# Patient Record
Sex: Male | Born: 1970 | Marital: Married | Smoking: Never smoker
Health system: Southern US, Community
[De-identification: ages and names within clinical notes are randomized; demographics above are authoritative.]

## PROBLEM LIST (undated history)

## (undated) DIAGNOSIS — J45909 Unspecified asthma, uncomplicated: Secondary | ICD-10-CM

## (undated) DIAGNOSIS — T8859XA Other complications of anesthesia, initial encounter: Secondary | ICD-10-CM

## (undated) DIAGNOSIS — T4145XA Adverse effect of unspecified anesthetic, initial encounter: Secondary | ICD-10-CM

## (undated) HISTORY — PX: APPENDECTOMY: SHX54

## (undated) HISTORY — PX: NASAL SINUS SURGERY: SHX719

## (undated) HISTORY — PX: HERNIA REPAIR: SHX51

## (undated) HISTORY — PX: TONSILLECTOMY: SUR1361

---

## 2012-11-29 ENCOUNTER — Emergency Department (HOSPITAL_COMMUNITY): Payer: 59

## 2012-11-29 ENCOUNTER — Encounter (HOSPITAL_COMMUNITY): Payer: Self-pay | Admitting: Emergency Medicine

## 2012-11-29 ENCOUNTER — Emergency Department (HOSPITAL_COMMUNITY)
Admission: EM | Admit: 2012-11-29 | Discharge: 2012-11-29 | Disposition: A | Discharge status: Home / Self Care | Payer: 59 | Attending: Emergency Medicine | Admitting: Emergency Medicine

## 2012-11-29 DIAGNOSIS — Y990 Civilian activity done for income or pay: Secondary | ICD-10-CM | POA: Insufficient documentation

## 2012-11-29 DIAGNOSIS — S5292XA Unspecified fracture of left forearm, initial encounter for closed fracture: Secondary | ICD-10-CM

## 2012-11-29 DIAGNOSIS — Y9289 Other specified places as the place of occurrence of the external cause: Secondary | ICD-10-CM | POA: Insufficient documentation

## 2012-11-29 DIAGNOSIS — W230XXA Caught, crushed, jammed, or pinched between moving objects, initial encounter: Secondary | ICD-10-CM | POA: Insufficient documentation

## 2012-11-29 DIAGNOSIS — S52309A Unspecified fracture of shaft of unspecified radius, initial encounter for closed fracture: Secondary | ICD-10-CM | POA: Insufficient documentation

## 2012-11-29 DIAGNOSIS — Y9389 Activity, other specified: Secondary | ICD-10-CM | POA: Insufficient documentation

## 2012-11-29 DIAGNOSIS — W010XXA Fall on same level from slipping, tripping and stumbling without subsequent striking against object, initial encounter: Secondary | ICD-10-CM | POA: Insufficient documentation

## 2012-11-29 MED ORDER — SODIUM CHLORIDE 0.9 % IV BOLUS (SEPSIS)
1000.0000 mL | Freq: Once | INTRAVENOUS | Status: AC
  Administered 2012-11-29: 1000 mL via INTRAVENOUS

## 2012-11-29 MED ORDER — MORPHINE SULFATE 4 MG/ML IJ SOLN
4.0000 mg | Freq: Once | INTRAMUSCULAR | Status: AC
  Administered 2012-11-29: 4 mg via INTRAVENOUS
  Filled 2012-11-29: qty 1

## 2012-11-29 MED ORDER — ONDANSETRON HCL 4 MG/2ML IJ SOLN
4.0000 mg | Freq: Once | INTRAMUSCULAR | Status: AC
  Administered 2012-11-29: 4 mg via INTRAVENOUS
  Filled 2012-11-29: qty 2

## 2012-11-29 MED ORDER — OXYCODONE-ACETAMINOPHEN 5-325 MG PO TABS: 1.0000 | ORAL_TABLET | ORAL | Status: DC | PRN

## 2012-11-29 NOTE — ED Notes (Signed)
Patient transported to X-ray 

## 2012-11-29 NOTE — Progress Notes (Signed)
Orthopedic Tech Progress Note Patient Details:  Chad Erickson Jun 29, 1970 [id number]  Ortho Devices Type of Ortho Device: Ace wrap;Arm sling;Sugartong splint Ortho Device/Splint Location: LUE Ortho Device/Splint Interventions: Ordered;Application   Jennye Moccasin 11/29/2012, 8:01 PM

## 2012-11-29 NOTE — ED Provider Notes (Signed)
CSN: [id number]     Arrival date & time 11/29/12  1529 History   First MD Initiated Contact with Patient 11/29/12 1537     Chief Complaint  Patient presents with  . Arm Injury   (Consider location/radiation/quality/duration/timing/severity/associated sxs/prior Treatment) HPI Chief complaint arm injury history provided by patient with help of interpreter  42 year old male comes to the arm injury. Patient reports he was at work when he tripped and fell over a piece of metal. During fall the patient's left arm did fall into belt drive on a machine which  patient was working with. Patient's arm stuck between a belt drive and a roller. Patient reported immediate pain in his left arm. He describes a sharp stabbing pain without radiation. It is localized to the middle of his forearm. No other pain is noted. 10 severity. It is made worse with palpation or movement. Patient was given fentanyl by EMS which provided some relief. No other alleviating factors noted. Patient denies any numbness tingling distal to the injury. All other associated signs and symptoms please refer to the review of systems section of chart.   History reviewed. No pertinent past medical history. History reviewed. No pertinent past surgical history. History reviewed. No pertinent family history. History  Substance Use Topics  . Smoking status: Never Smoker   . Smokeless tobacco: Never Used  . Alcohol Use: No    Review of Systems  Constitutional: Negative for fatigue.  Respiratory: Negative for shortness of breath.   Cardiovascular: Negative for chest pain.  Gastrointestinal: Negative for abdominal pain.  Genitourinary: Negative for dysuria.  Musculoskeletal: Positive for joint swelling (L arm- wrist and elbow feel swollen).  Skin: Negative for rash.  Neurological: Negative for headaches.  Psychiatric/Behavioral: Negative for agitation.  All other systems reviewed and are negative.    Allergies  Review of patient's  allergies indicates no known allergies.  Home Medications   Current Outpatient Rx  Name  Route  Sig  Dispense  Refill  . oxyCODONE-acetaminophen (PERCOCET/ROXICET) 5-325 MG per tablet   Oral   Take 1 tablet by mouth every 4 (four) hours as needed for severe pain.   20 tablet   0    BP 114/61  Pulse 75  Temp(Src) 98.6 F (37 C) (Oral)  Resp 15  SpO2 99% Physical Exam  Nursing note and vitals reviewed. Constitutional: He is oriented to person, place, and time. He appears well-developed and well-nourished.  HENT:  Head: Normocephalic and atraumatic.  Eyes: EOM are normal. Pupils are equal, round, and reactive to light.  Neck: Normal range of motion.  Cardiovascular: Normal rate, regular rhythm and intact distal pulses.   Pulmonary/Chest: Effort normal and breath sounds normal. No respiratory distress.  Abdominal: Soft. He exhibits no distension. There is no tenderness.  Musculoskeletal: Normal range of motion.  L forearm ttp and visual deformity of limb. Neurovasc intact throughout. No laceration of signs of open fracture   Neurological: He is alert and oriented to person, place, and time. No cranial nerve deficit. He exhibits normal muscle tone. Coordination normal.  Skin: Skin is warm and dry. No rash noted.  Small superficial abrasion to skin on L elbow from belt drive scrapping elbow.   Psychiatric: He has a normal mood and affect. His behavior is normal. Judgment and thought content normal.    ED Course  Procedures (including critical care time) Labs Review Labs Reviewed - No data to display Imaging Review Dg Elbow 2 Views Left  11/29/2012   CLINICAL  DATA:  Trauma  EXAM: LEFT ELBOW - 2 VIEW  COMPARISON:  None.  FINDINGS: There is no evidence of fracture, dislocation, or joint effusion within the elbow. There is no evidence of arthropathy or other focal bone abnormality. Soft tissues are unremarkable. A radial shaft fracture is partially visualized.  IMPRESSION: No  evidence of acute osseous abnormalities within the elbow.   Electronically Signed   By: Salome Holmes M.D.   On: 11/29/2012 17:46   Dg Forearm Left  11/29/2012   CLINICAL DATA:  Trauma  EXAM: LEFT FOREARM - 2 VIEW  COMPARISON:  None.  FINDINGS: Comminuted fracture identified along the midshaft of the radius demonstrating dorsal ulnar directed displacement and angulation.  IMPRESSION: Comminuted radial shaft fracture   Electronically Signed   By: Salome Holmes M.D.   On: 11/29/2012 17:44   Dg Wrist Complete Left  11/29/2012   CLINICAL DATA:  Trauma, pain  EXAM: LEFT WRIST - COMPLETE 3+ VIEW  COMPARISON:  None.  FINDINGS: There is no evidence of fracture or dislocation. There is no evidence of arthropathy or other focal bone abnormality. Soft tissues are unremarkable.  IMPRESSION: Negative.   Electronically Signed   By: Salome Holmes M.D.   On: 11/29/2012 17:43   Dg Humerus Left  11/29/2012   CLINICAL DATA:  Trauma, fall, injury left to left forearm  EXAM: LEFT HUMERUS - 2+ VIEW  COMPARISON:  None  FINDINGS: Osseous mineralization normal.  Joint spaces preserved.  No fracture, dislocation, or bone destruction.  IMPRESSION: No acute osseous abnormalities.   Electronically Signed   By: Ulyses Southward M.D.   On: 11/29/2012 17:47    EKG Interpretation   None       MDM   1. Fracture, radius, left, closed, initial encounter    Afebrile vital signs stable on arrival. 42 year old male with left arm injury. Injury occurred during fall at work when arm got stuck in it machinery belt. Patient denies any further injuries to the head had no loss of consciousness no tenderness to palpation on thorough examination. Pain localized left arm. X-ray demonstrated comminuted fracture of the midshaft radius. On exam patient neurovascularly intact. No signs of compartment syndrome on exam. Orthopedic consult and recommend application sugar tong splint follow up with clinic tomorrow.  8:09 PM Sugar tong placed by  orthopedic tech. On reexam patient is complaining of minimal pain. Strong pulses distal to injury neurovascularly intact. The patient was given strict precautions for numbness tingling motor dysfunction etc. Patient followed in clinic or morning. Was given a prescription for oxycodone for pain management. Remained stable until discharge   Patient discussed with attending Dr. Freida Busman.       Bridgett Larsson, MD 11/29/12 2013

## 2012-11-29 NOTE — ED Notes (Signed)
As pt fell, his left arm went into a machine which had a "roller band" that was actively turned on up to his elbow.

## 2012-11-29 NOTE — ED Provider Notes (Signed)
CSN: [id number]     Arrival date & time 11/29/12  1529 History   First MD Initiated Contact with Patient 11/29/12 1537     Chief Complaint  Patient presents with  . Arm Injury   (Consider location/radiation/quality/duration/timing/severity/associated sxs/prior Treatment) HPI Comments: Pt at work and had a fall. During fall got L arm pushed into machine belt. Fell to ground did not hit head, no LOC, arm out on fall. Felt pop in forearm. Tetanus UTD per pt.    Patient is a 42 y.o. male presenting with trauma and arm injury.  Trauma   Current symptoms:      Associated symptoms:            Denies abdominal pain, back pain, chest pain and headache.  Arm Injury Location:  Arm Time since incident:  1 hour Injury: yes   Mechanism of injury comment:  Fall and crush Arm location:  L forearm Pain details:    Quality:  Sharp   Radiates to:  Does not radiate   Severity:  Severe   Onset quality:  Sudden   Timing:  Constant   Progression:  Unchanged Chronicity:  New Tetanus status:  Up to date Prior injury to area:  No Relieved by: fentanyl. Worsened by:  Movement Associated symptoms: no back pain, no decreased range of motion, no fatigue, no numbness and no tingling     History reviewed. No pertinent past medical history. History reviewed. No pertinent past surgical history. History reviewed. No pertinent family history. History  Substance Use Topics  . Smoking status: Never Smoker   . Smokeless tobacco: Never Used  . Alcohol Use: No    Review of Systems  Constitutional: Negative for fatigue.  Respiratory: Negative for shortness of breath.   Cardiovascular: Negative for chest pain.  Gastrointestinal: Negative for abdominal pain.  Genitourinary: Negative for dysuria.  Musculoskeletal: Negative for back pain.  Skin: Negative for rash.  Neurological: Negative for headaches.  Psychiatric/Behavioral: Negative for agitation.  All other systems reviewed and are  negative.    Allergies  Review of patient's allergies indicates no known allergies.  Home Medications   Current Outpatient Rx  Name  Route  Sig  Dispense  Refill  . oxyCODONE-acetaminophen (PERCOCET/ROXICET) 5-325 MG per tablet   Oral   Take 1 tablet by mouth every 4 (four) hours as needed for severe pain.   20 tablet   0    BP 113/66  Pulse 76  Temp(Src) 98.6 F (37 C) (Oral)  Resp 14  SpO2 97% Physical Exam  Nursing note and vitals reviewed. Constitutional: He is oriented to person, place, and time. He appears well-developed and well-nourished.  HENT:  Head: Normocephalic and atraumatic.  Eyes: EOM are normal. Pupils are equal, round, and reactive to light.  Neck: Normal range of motion.  Cardiovascular: Normal rate, regular rhythm and intact distal pulses.   Pulmonary/Chest: Effort normal and breath sounds normal. No respiratory distress.  Abdominal: Soft. He exhibits no distension. There is no tenderness.  Musculoskeletal: Normal range of motion.  Neurological: He is alert and oriented to person, place, and time. No cranial nerve deficit. He exhibits normal muscle tone. Coordination normal.  Skin: Skin is warm and dry. No rash noted.  Psychiatric: He has a normal mood and affect. His behavior is normal. Judgment and thought content normal.    ED Course  Procedures (including critical care time) Labs Review Labs Reviewed - No data to display Imaging Review Dg Elbow 2 Views Left  11/29/2012   CLINICAL DATA:  Trauma  EXAM: LEFT ELBOW - 2 VIEW  COMPARISON:  None.  FINDINGS: There is no evidence of fracture, dislocation, or joint effusion within the elbow. There is no evidence of arthropathy or other focal bone abnormality. Soft tissues are unremarkable. A radial shaft fracture is partially visualized.  IMPRESSION: No evidence of acute osseous abnormalities within the elbow.   Electronically Signed   By: Salome Holmes M.D.   On: 11/29/2012 17:46   Dg Forearm  Left  11/29/2012   CLINICAL DATA:  Trauma  EXAM: LEFT FOREARM - 2 VIEW  COMPARISON:  None.  FINDINGS: Comminuted fracture identified along the midshaft of the radius demonstrating dorsal ulnar directed displacement and angulation.  IMPRESSION: Comminuted radial shaft fracture   Electronically Signed   By: Salome Holmes M.D.   On: 11/29/2012 17:44   Dg Wrist Complete Left  11/29/2012   CLINICAL DATA:  Trauma, pain  EXAM: LEFT WRIST - COMPLETE 3+ VIEW  COMPARISON:  None.  FINDINGS: There is no evidence of fracture or dislocation. There is no evidence of arthropathy or other focal bone abnormality. Soft tissues are unremarkable.  IMPRESSION: Negative.   Electronically Signed   By: Salome Holmes M.D.   On: 11/29/2012 17:43   Dg Humerus Left  11/29/2012   CLINICAL DATA:  Trauma, fall, injury left to left forearm  EXAM: LEFT HUMERUS - 2+ VIEW  COMPARISON:  None  FINDINGS: Osseous mineralization normal.  Joint spaces preserved.  No fracture, dislocation, or bone destruction.  IMPRESSION: No acute osseous abnormalities.   Electronically Signed   By: Ulyses Southward M.D.   On: 11/29/2012 17:47    EKG Interpretation   None       MDM   1. Fracture, radius, left, closed, initial encounter    AFVSS, NAD. Patient with obvious deformity of left arm and severe tenderness to palpation. On initial exam patient was neurovascularly intact. No other injuries are noted on thorough secondary examination. X-ray obtained showed a comminuted fracture of the midshaft radius. Small abrasion above the elbow however no lacerations. No signs of open fracture. Orthopedics was consulted believe appropriate for follow up in clinic tomorrow. Patient was placed in a sugar tong splint. Examination Brandell Maready splinting revealed intact distal pulses. Neuro exam within normal limits. Patient discharged home with prescription for Norco followup with ortho clinic tomorrow.   Patient discussed with attending Dr. Freida Busman.      Bridgett Larsson,  MD 11/30/12 [phone]

## 2012-11-29 NOTE — ED Provider Notes (Signed)
I saw and evaluated the patient, reviewed the resident's note and I agree with the findings and plan.  EKG Interpretation   None       Patient seen examined and left humerus with abrasion without signs of compartment syndrome. Tender left forearm as well with range of motion of the left hand. Deformity noted. Awaiting x-rays. Radial pulse 2+. No apparent compartment syndrome  Toy Baker, MD 11/29/12 [phone]

## 2012-11-29 NOTE — ED Notes (Signed)
Pt from work via [employer].  Pt tripped over metal and fell landing on his left arm.  Deformity to left forearm noted and abrasion to left upper arm.  EMS gave 150 mg fentanyl. Pt in NAD, A&O.

## 2012-11-30 ENCOUNTER — Encounter (HOSPITAL_COMMUNITY): Payer: Self-pay | Admitting: *Deleted

## 2012-11-30 NOTE — ED Provider Notes (Signed)
I saw and evaluated the patient, reviewed the resident's note and I agree with the findings and plan.  Jozette Castrellon T Kassity Woodson, MD 11/30/12 1541 

## 2012-11-30 NOTE — ED Provider Notes (Signed)
I saw and evaluated the patient, reviewed the resident's note and I agree with the findings and plan.  Toy Baker, MD 11/30/12 1538

## 2012-12-03 ENCOUNTER — Encounter (HOSPITAL_COMMUNITY): Payer: Self-pay | Admitting: *Deleted

## 2012-12-03 ENCOUNTER — Ambulatory Visit (HOSPITAL_COMMUNITY): Payer: Worker's Compensation | Admitting: Anesthesiology

## 2012-12-03 ENCOUNTER — Encounter (HOSPITAL_COMMUNITY)
Admission: RE | Disposition: A | Discharge status: Home / Self Care | Payer: Self-pay | Source: Ambulatory Visit | Attending: Orthopedic Surgery

## 2012-12-03 ENCOUNTER — Encounter (HOSPITAL_COMMUNITY): Payer: Worker's Compensation | Admitting: Anesthesiology

## 2012-12-03 ENCOUNTER — Ambulatory Visit (HOSPITAL_COMMUNITY)
Admission: RE | Admit: 2012-12-03 | Discharge: 2012-12-05 | Disposition: A | Discharge status: Home / Self Care | Payer: Worker's Compensation | Source: Ambulatory Visit | Attending: Orthopedic Surgery | Admitting: Orthopedic Surgery

## 2012-12-03 DIAGNOSIS — W19XXXA Unspecified fall, initial encounter: Secondary | ICD-10-CM | POA: Insufficient documentation

## 2012-12-03 DIAGNOSIS — S52309A Unspecified fracture of shaft of unspecified radius, initial encounter for closed fracture: Secondary | ICD-10-CM | POA: Insufficient documentation

## 2012-12-03 HISTORY — PX: ORIF RADIAL FRACTURE: SHX5113

## 2012-12-03 HISTORY — DX: Other complications of anesthesia, initial encounter: T88.59XA

## 2012-12-03 HISTORY — DX: Adverse effect of unspecified anesthetic, initial encounter: T41.45XA

## 2012-12-03 LAB — CBC
HCT: 42.9 % (ref 39.0–52.0)
Hemoglobin: 15.4 g/dL (ref 13.0–17.0)
MCH: 33.3 pg (ref 26.0–34.0)
MCHC: 35.9 g/dL (ref 30.0–36.0)
MCV: 92.9 fL (ref 78.0–100.0)
RBC: 4.62 MIL/uL (ref 4.22–5.81)

## 2012-12-03 SURGERY — OPEN REDUCTION INTERNAL FIXATION (ORIF) RADIAL FRACTURE
Anesthesia: Regional | Site: Arm Lower | Laterality: Left | Wound class: Clean

## 2012-12-03 MED ORDER — HYDROMORPHONE HCL PF 1 MG/ML IJ SOLN
INTRAMUSCULAR | Status: AC
  Filled 2012-12-03: qty 1

## 2012-12-03 MED ORDER — MIDAZOLAM HCL 2 MG/2ML IJ SOLN
2.0000 mg | Freq: Once | INTRAMUSCULAR | Status: AC
  Administered 2012-12-03: 2 mg via INTRAVENOUS

## 2012-12-03 MED ORDER — FENTANYL CITRATE 0.05 MG/ML IJ SOLN
INTRAMUSCULAR | Status: AC
  Administered 2012-12-03: 100 ug via INTRAVENOUS
  Filled 2012-12-03: qty 2

## 2012-12-03 MED ORDER — FENTANYL CITRATE 0.05 MG/ML IJ SOLN
INTRAMUSCULAR | Status: DC | PRN
  Administered 2012-12-03 (×2): 50 ug via INTRAVENOUS

## 2012-12-03 MED ORDER — DOCUSATE SODIUM 100 MG PO CAPS
100.0000 mg | ORAL_CAPSULE | Freq: Two times a day (BID) | ORAL | Status: DC
  Administered 2012-12-04 – 2012-12-05 (×3): 100 mg via ORAL
  Filled 2012-12-03 (×5): qty 1

## 2012-12-03 MED ORDER — LACTATED RINGERS IV SOLN
INTRAVENOUS | Status: DC | PRN
  Administered 2012-12-03 (×2): via INTRAVENOUS

## 2012-12-03 MED ORDER — LACTATED RINGERS IV SOLN
INTRAVENOUS | Status: DC
  Administered 2012-12-03: 16:00:00 via INTRAVENOUS

## 2012-12-03 MED ORDER — ONDANSETRON HCL 4 MG PO TABS: 4.0000 mg | ORAL_TABLET | Freq: Four times a day (QID) | ORAL | Status: DC | PRN

## 2012-12-03 MED ORDER — OXYCODONE-ACETAMINOPHEN 5-325 MG PO TABS
1.0000 | ORAL_TABLET | ORAL | Status: DC | PRN
  Administered 2012-12-04 – 2012-12-05 (×2): 2 via ORAL
  Filled 2012-12-03 (×2): qty 2

## 2012-12-03 MED ORDER — CHLORHEXIDINE GLUCONATE 4 % EX LIQD: 60.0000 mL | Freq: Once | CUTANEOUS | Status: DC

## 2012-12-03 MED ORDER — MIDAZOLAM HCL 2 MG/2ML IJ SOLN
INTRAMUSCULAR | Status: AC
  Administered 2012-12-03: 2 mg via INTRAVENOUS
  Filled 2012-12-03: qty 2

## 2012-12-03 MED ORDER — OXYCODONE HCL 5 MG PO TABS: 5.0000 mg | ORAL_TABLET | Freq: Once | ORAL | Status: DC | PRN

## 2012-12-03 MED ORDER — OXYCODONE-ACETAMINOPHEN 10-325 MG PO TABS: 1.0000 | ORAL_TABLET | ORAL | Status: DC | PRN

## 2012-12-03 MED ORDER — VITAMIN C 500 MG PO TABS
1000.0000 mg | ORAL_TABLET | Freq: Every day | ORAL | Status: DC
  Administered 2012-12-04 – 2012-12-05 (×2): 1000 mg via ORAL
  Filled 2012-12-03 (×2): qty 2

## 2012-12-03 MED ORDER — FENTANYL CITRATE 0.05 MG/ML IJ SOLN
100.0000 ug | Freq: Once | INTRAMUSCULAR | Status: AC
Start: 2012-12-03 — End: 2012-12-03
  Administered 2012-12-03: 100 ug via INTRAVENOUS

## 2012-12-03 MED ORDER — KCL IN DEXTROSE-NACL 20-5-0.45 MEQ/L-%-% IV SOLN
INTRAVENOUS | Status: DC
  Administered 2012-12-03: 23:00:00 via INTRAVENOUS
  Filled 2012-12-03 (×5): qty 1000

## 2012-12-03 MED ORDER — OXYCODONE HCL 5 MG/5ML PO SOLN: 5.0000 mg | Freq: Once | ORAL | Status: DC | PRN

## 2012-12-03 MED ORDER — METHOCARBAMOL 500 MG PO TABS: 500.0000 mg | ORAL_TABLET | Freq: Four times a day (QID) | ORAL | Status: DC

## 2012-12-03 MED ORDER — ONDANSETRON HCL 4 MG PO TABS: 4.0000 mg | ORAL_TABLET | Freq: Three times a day (TID) | ORAL | Status: DC | PRN

## 2012-12-03 MED ORDER — INFLUENZA VAC SPLIT QUAD 0.5 ML IM SUSP
0.5000 mL | INTRAMUSCULAR | Status: DC
  Filled 2012-12-03: qty 0.5

## 2012-12-03 MED ORDER — DIPHENHYDRAMINE HCL 25 MG PO CAPS: 25.0000 mg | ORAL_CAPSULE | Freq: Four times a day (QID) | ORAL | Status: DC | PRN

## 2012-12-03 MED ORDER — METHOCARBAMOL 500 MG PO TABS
500.0000 mg | ORAL_TABLET | Freq: Four times a day (QID) | ORAL | Status: DC | PRN
  Administered 2012-12-04 – 2012-12-05 (×2): 500 mg via ORAL
  Filled 2012-12-03 (×3): qty 1

## 2012-12-03 MED ORDER — BUPIVACAINE-EPINEPHRINE PF 0.5-1:200000 % IJ SOLN
INTRAMUSCULAR | Status: DC | PRN
  Administered 2012-12-03: 30 mL via PERINEURAL

## 2012-12-03 MED ORDER — BUPIVACAINE HCL (PF) 0.25 % IJ SOLN
INTRAMUSCULAR | Status: DC | PRN
  Administered 2012-12-03: 8 mL

## 2012-12-03 MED ORDER — HYDROMORPHONE HCL PF 1 MG/ML IJ SOLN: 0.2500 mg | INTRAMUSCULAR | Status: DC | PRN

## 2012-12-03 MED ORDER — MIDAZOLAM HCL 5 MG/5ML IJ SOLN
INTRAMUSCULAR | Status: DC | PRN
  Administered 2012-12-03: 2 mg via INTRAVENOUS

## 2012-12-03 MED ORDER — ZOLPIDEM TARTRATE 5 MG PO TABS: 5.0000 mg | ORAL_TABLET | Freq: Every evening | ORAL | Status: DC | PRN

## 2012-12-03 MED ORDER — ONDANSETRON HCL 4 MG/2ML IJ SOLN: 4.0000 mg | Freq: Four times a day (QID) | INTRAMUSCULAR | Status: DC | PRN

## 2012-12-03 MED ORDER — HYDROMORPHONE HCL PF 1 MG/ML IJ SOLN
0.5000 mg | INTRAMUSCULAR | Status: DC | PRN
  Administered 2012-12-04: 1 mg via INTRAVENOUS
  Filled 2012-12-03: qty 1

## 2012-12-03 MED ORDER — CEFAZOLIN SODIUM 1-5 GM-% IV SOLN
1.0000 g | Freq: Three times a day (TID) | INTRAVENOUS | Status: DC
  Administered 2012-12-04 – 2012-12-05 (×4): 1 g via INTRAVENOUS
  Filled 2012-12-03 (×9): qty 50

## 2012-12-03 MED ORDER — METHOCARBAMOL 100 MG/ML IJ SOLN
500.0000 mg | Freq: Four times a day (QID) | INTRAVENOUS | Status: DC | PRN
  Filled 2012-12-03: qty 5

## 2012-12-03 MED ORDER — 0.9 % SODIUM CHLORIDE (POUR BTL) OPTIME
TOPICAL | Status: DC | PRN
  Administered 2012-12-03: 1000 mL

## 2012-12-03 MED ORDER — LIDOCAINE HCL (CARDIAC) 20 MG/ML IV SOLN
INTRAVENOUS | Status: DC | PRN
  Administered 2012-12-03: 60 mg via INTRAVENOUS

## 2012-12-03 MED ORDER — CEFAZOLIN SODIUM 1-5 GM-% IV SOLN: 1.0000 g | INTRAVENOUS | Status: DC

## 2012-12-03 MED ORDER — CEFAZOLIN SODIUM-DEXTROSE 2-3 GM-% IV SOLR
2.0000 g | INTRAVENOUS | Status: AC
  Administered 2012-12-03: 2 g via INTRAVENOUS
  Filled 2012-12-03: qty 50

## 2012-12-03 MED ORDER — VITAMIN C 500 MG PO TABS: 500.0000 mg | ORAL_TABLET | Freq: Every day | ORAL | Status: DC

## 2012-12-03 MED ORDER — HYDROCODONE-ACETAMINOPHEN 10-325 MG PO TABS
1.0000 | ORAL_TABLET | ORAL | Status: DC | PRN
  Administered 2012-12-04 – 2012-12-05 (×3): 2 via ORAL
  Filled 2012-12-03 (×3): qty 2

## 2012-12-03 MED ORDER — ONDANSETRON HCL 4 MG/2ML IJ SOLN
INTRAMUSCULAR | Status: DC | PRN
  Administered 2012-12-03: 4 mg via INTRAVENOUS

## 2012-12-03 MED ORDER — OXYCODONE-ACETAMINOPHEN 5-325 MG PO TABS: 1.0000 | ORAL_TABLET | ORAL | Status: DC | PRN

## 2012-12-03 MED ORDER — DOCUSATE SODIUM 100 MG PO CAPS: 100.0000 mg | ORAL_CAPSULE | Freq: Two times a day (BID) | ORAL | Status: DC

## 2012-12-03 MED ORDER — PROPOFOL 10 MG/ML IV BOLUS
INTRAVENOUS | Status: DC | PRN
  Administered 2012-12-03: 200 mg via INTRAVENOUS

## 2012-12-03 SURGICAL SUPPLY — 59 items
BANDAGE ELASTIC 3 VELCRO ST LF (GAUZE/BANDAGES/DRESSINGS) ×2 IMPLANT
BANDAGE ELASTIC 4 VELCRO ST LF (GAUZE/BANDAGES/DRESSINGS) ×2 IMPLANT
BANDAGE GAUZE ELAST BULKY 4 IN (GAUZE/BANDAGES/DRESSINGS) ×2 IMPLANT
BIT DRILL 2.5X2.75 QC CALB (BIT) ×2 IMPLANT
BLADE SURG ROTATE 9660 (MISCELLANEOUS) IMPLANT
BNDG ESMARK 4X9 LF (GAUZE/BANDAGES/DRESSINGS) ×2 IMPLANT
CLOTH BEACON ORANGE TIMEOUT ST (SAFETY) IMPLANT
CORDS BIPOLAR (ELECTRODE) ×2 IMPLANT
COVER SURGICAL LIGHT HANDLE (MISCELLANEOUS) ×2 IMPLANT
CUFF TOURNIQUET SINGLE 18IN (TOURNIQUET CUFF) ×2 IMPLANT
CUFF TOURNIQUET SINGLE 24IN (TOURNIQUET CUFF) IMPLANT
DRAIN TLS ROUND 10FR (DRAIN) IMPLANT
DRAPE INCISE IOBAN 66X45 STRL (DRAPES) ×2 IMPLANT
DRAPE OEC MINIVIEW 54X84 (DRAPES) ×4 IMPLANT
DRAPE SURG 17X11 SM STRL (DRAPES) ×2 IMPLANT
DRSG ADAPTIC 3X8 NADH LF (GAUZE/BANDAGES/DRESSINGS) ×2 IMPLANT
ELECT REM PT RETURN 9FT ADLT (ELECTROSURGICAL)
ELECTRODE REM PT RTRN 9FT ADLT (ELECTROSURGICAL) IMPLANT
GAUZE SPONGE 4X4 16PLY XRAY LF (GAUZE/BANDAGES/DRESSINGS) ×2 IMPLANT
GLOVE BIOGEL PI IND STRL 8.5 (GLOVE) ×1 IMPLANT
GLOVE BIOGEL PI INDICATOR 8.5 (GLOVE) ×1
GLOVE SURG ORTHO 8.0 STRL STRW (GLOVE) ×2 IMPLANT
GOWN PREVENTION PLUS XLARGE (GOWN DISPOSABLE) ×2 IMPLANT
GOWN STRL NON-REIN LRG LVL3 (GOWN DISPOSABLE) ×4 IMPLANT
KIT BASIN OR (CUSTOM PROCEDURE TRAY) ×2 IMPLANT
KIT ROOM TURNOVER OR (KITS) ×2 IMPLANT
MANIFOLD NEPTUNE II (INSTRUMENTS) ×2 IMPLANT
NEEDLE HYPO 25X1 1.5 SAFETY (NEEDLE) ×2 IMPLANT
NS IRRIG 1000ML POUR BTL (IV SOLUTION) ×2 IMPLANT
PACK ORTHO EXTREMITY (CUSTOM PROCEDURE TRAY) ×2 IMPLANT
PAD ARMBOARD 7.5X6 YLW CONV (MISCELLANEOUS) ×4 IMPLANT
PAD CAST 3X4 CTTN HI CHSV (CAST SUPPLIES) ×1 IMPLANT
PAD CAST 4YDX4 CTTN HI CHSV (CAST SUPPLIES) ×1 IMPLANT
PADDING CAST ABS 3INX4YD NS (CAST SUPPLIES) ×1
PADDING CAST ABS COTTON 3X4 (CAST SUPPLIES) ×1 IMPLANT
PADDING CAST COTTON 3X4 STRL (CAST SUPPLIES) ×1
PADDING CAST COTTON 4X4 STRL (CAST SUPPLIES) ×1
PLATE LOCK COMP 7H FOOT (Plate) ×2 IMPLANT
SCREW CORTICAL 3.5MM  16MM (Screw) ×3 IMPLANT
SCREW CORTICAL 3.5MM 14MM (Screw) ×2 IMPLANT
SCREW CORTICAL 3.5MM 16MM (Screw) ×3 IMPLANT
SCREW CORTICAL 3.5MM 18MM (Screw) ×6 IMPLANT
SOAP 2 % CHG 4 OZ (WOUND CARE) ×2 IMPLANT
SPONGE GAUZE 4X4 12PLY (GAUZE/BANDAGES/DRESSINGS) ×2 IMPLANT
SPONGE LAP 4X18 X RAY DECT (DISPOSABLE) ×2 IMPLANT
STRIP CLOSURE SKIN 1/2X4 (GAUZE/BANDAGES/DRESSINGS) IMPLANT
SUT CHROMIC 3 0 PS 2 (SUTURE) IMPLANT
SUT ETHILON 4 0 PS 2 18 (SUTURE) ×2 IMPLANT
SUT MNCRL AB 4-0 PS2 18 (SUTURE) IMPLANT
SUT PROLENE 3 0 PS 2 (SUTURE) ×4 IMPLANT
SUT VIC AB 2-0 FS1 27 (SUTURE) ×2 IMPLANT
SUT VICRYL 4-0 PS2 18IN ABS (SUTURE) ×4 IMPLANT
SYR CONTROL 10ML LL (SYRINGE) IMPLANT
SYSTEM CHEST DRAIN TLS 7FR (DRAIN) IMPLANT
TOWEL OR 17X24 6PK STRL BLUE (TOWEL DISPOSABLE) ×2 IMPLANT
TOWEL OR 17X26 10 PK STRL BLUE (TOWEL DISPOSABLE) ×2 IMPLANT
TUBE CONNECTING 12X1/4 (SUCTIONS) ×2 IMPLANT
WATER STERILE IRR 1000ML POUR (IV SOLUTION) IMPLANT
YANKAUER SUCT BULB TIP NO VENT (SUCTIONS) IMPLANT

## 2012-12-03 NOTE — Anesthesia Postprocedure Evaluation (Signed)
  Anesthesia Post-op Note  Patient: Chad Erickson  Procedure(s) Performed: Procedure(s): OPEN REDUCTION INTERNAL FIXATION (ORIF) LEFT RADIAL SHAFT FRACTURE (Left)  Patient Location: PACU  Anesthesia Type:General  Level of Consciousness: awake, alert , oriented and patient cooperative  Airway and Oxygen Therapy: Patient Spontanous Breathing  Post-op Pain: mild  Post-op Assessment: Post-op Vital signs reviewed, Patient's Cardiovascular Status Stable, Respiratory Function Stable, Patent Airway, No signs of Nausea or vomiting and Pain level controlled  Post-op Vital Signs: stable  Complications: No apparent anesthesia complications

## 2012-12-03 NOTE — Anesthesia Procedure Notes (Signed)
Anesthesia Regional Block:  Supraclavicular block  Pre-Anesthetic Checklist: ,, timeout performed, Correct Patient, Correct Site, Correct Laterality, Correct Procedure, Correct Position, site marked, Risks and benefits discussed, pre-op evaluation, post-op pain management  Laterality: Left  Prep: Maximum Sterile Barrier Precautions used and chloraprep       Needles:  Injection technique: Single-shot  Needle Type: Echogenic Stimulator Needle     Needle Length: 5cm 5 cm Needle Gauge: 22 and 22 G    Additional Needles:  Procedures: ultrasound guided (picture in chart) Supraclavicular block Narrative:  Start time: 12/03/2012 4:23 PM End time: 12/03/2012 4:33 PM Injection made incrementally with aspirations every 5 mL. Anesthesiologist: Kashten Gowin,MD  Additional Notes: 2% Lidocaine skin wheel. Intercostobrachial block with 8cc of 0.25% Bupivicaine plain.

## 2012-12-03 NOTE — H&P (Signed)
Chad Erickson is an 42 y.o. male.   Chief Complaint: Left forearm fracture HPI: Pt with fall at work sustaining closed left forearm fracture Pt seen/evaluated in office and here for surgery today No prior injury to forearm RHD  Past Medical History  Diagnosis Date  . Complication of anesthesia     ? water in lungs after hernia surgery    Past Surgical History  Procedure Laterality Date  . Nasal sinus surgery    . Hernia repair      bilateral inguinal hernias  . Tonsillectomy    . Appendectomy      History reviewed. No pertinent family history. Social History:  reports that he has never smoked. He has never used smokeless tobacco. He reports that he does not drink alcohol or use illicit drugs.  Allergies: No Known Allergies  No prescriptions prior to admission    No results found for this or any previous visit (from the past 48 hour(s)). No results found.  ROSNO RECENT ILLNESSES OR HOSPITALIZATIONS  There were no vitals taken for this visit. Physical Exam  General Appearance:  Alert, cooperative, no distress, appears stated age  Head:  Normocephalic, without obvious abnormality, atraumatic  Eyes:  Pupils equal, conjunctiva/corneas clear,         Throat: Lips, mucosa, and tongue normal; teeth and gums normal  Neck: No visible masses     Lungs:   respirations unlabored  Chest Wall:  No tenderness or deformity  Heart:  Regular rate and rhythm,  Abdomen:   Soft, non-tender,         Extremities: LEFT FOREARM: SPLINT IN PLACE FINGERS WARM WELL PERFUSED ABLE TO EXTEND THUMB AND DIGITIS FINGERS WARM WELL PERFUSED GOOD DIGITAL MOTION BUT NOT FULL  Pulses: 2+ and symmetric  Skin: Skin color, texture, turgor normal, no rashes or lesions     Neurologic: Normal    Assessment/Plan LEFT RADIAL SHAFT FRACTURE DISPLACED  LEFT FOREARM OPEN REDUCTION INTERNAL FIXATION AND REPAIR AS INDICATED  R/B/A DISCUSSED WITH PT IN OFFICE.  PT VOICED UNDERSTANDING OF  PLAN CONSENT SIGNED DAY OF SURGERY PT SEEN AND EXAMINED PRIOR TO OPERATIVE PROCEDURE/DAY OF SURGERY SITE MARKED. QUESTIONS ANSWERED WILL REMAIN IN HOSPITAL FOR OBSERVATION FOLLOWING SURGERY  Sharma Covert 12/03/2012, 9:04 AM

## 2012-12-03 NOTE — Brief Op Note (Signed)
12/03/2012  12:53 PM  PATIENT:  Chad Erickson  42 y.o. male  PRE-OPERATIVE DIAGNOSIS:  LEFT FORE ARM RADIAL SHAFT FRACTURE  POST-OPERATIVE DIAGNOSIS:  SAME  PROCEDURE:  Procedure(s): LEFT OPEN REDUCTION INTERNAL FIXATION (ORIF) RADIAL SHAFT FRACTURE (Left)  SURGEON:  Surgeon(s) and Role:    * Sharma Covert, MD - Primary  PHYSICIAN ASSISTANT:   ASSISTANTS: none   ANESTHESIA:   general  EBL:     BLOOD ADMINISTERED:none  DRAINS: none   LOCAL MEDICATIONS USED:  MARCAINE     SPECIMEN:  No Specimen  DISPOSITION OF SPECIMEN:  N/A  COUNTS:  YES  TOURNIQUET:  * No tourniquets in log *  DICTATION: 161096  PLAN OF CARE: Admit for overnight observation  PATIENT DISPOSITION:  PACU - hemodynamically stable.   Delay start of Pharmacological VTE agent (>24hrs) due to surgical blood loss or risk of bleeding: not applicable

## 2012-12-03 NOTE — Anesthesia Preprocedure Evaluation (Signed)
Anesthesia Evaluation  Patient identified by MRN, date of birth, ID band Patient awake    Reviewed: Allergy & Precautions, H&P , NPO status , Patient's Chart, lab work & pertinent test results  Airway Mallampati: I TM Distance: >3 FB Neck ROM: Full    Dental no notable dental hx. (+) Teeth Intact and Dental Advisory Given   Pulmonary neg pulmonary ROS,  breath sounds clear to auscultation  Pulmonary exam normal       Cardiovascular negative cardio ROS  Rhythm:Regular Rate:Normal     Neuro/Psych negative neurological ROS  negative psych ROS   GI/Hepatic negative GI ROS, Neg liver ROS,   Endo/Other  negative endocrine ROS  Renal/GU negative Renal ROS  negative genitourinary   Musculoskeletal   Abdominal   Peds  Hematology negative hematology ROS (+)   Anesthesia Other Findings   Reproductive/Obstetrics negative OB ROS                           Anesthesia Physical Anesthesia Plan  ASA: I  Anesthesia Plan: General and Regional   Post-op Pain Management:    Induction: Intravenous  Airway Management Planned: LMA  Additional Equipment:   Intra-op Plan:   Post-operative Plan: Extubation in OR  Informed Consent: I have reviewed the patients History and Physical, chart, labs and discussed the procedure including the risks, benefits and alternatives for the proposed anesthesia with the patient or authorized representative who has indicated his/her understanding and acceptance.   Dental advisory given  Plan Discussed with: CRNA  Anesthesia Plan Comments:         Anesthesia Quick Evaluation  

## 2012-12-03 NOTE — Preoperative (Signed)
Beta Blockers   Reason not to administer Beta Blockers:Not Applicable 

## 2012-12-03 NOTE — Transfer of Care (Signed)
Immediate Anesthesia Transfer of Care Note  Patient: Chad Erickson  Procedure(s) Performed: Procedure(s): OPEN REDUCTION INTERNAL FIXATION (ORIF) LEFT RADIAL SHAFT FRACTURE (Left)  Patient Location: PACU  Anesthesia Type:General  Level of Consciousness: awake  Airway & Oxygen Therapy: Patient Spontanous Breathing and Patient connected to nasal cannula oxygen  Post-op Assessment: Report given to PACU RN and Post -op Vital signs reviewed and stable  Post vital signs: Reviewed and stable  Complications: No apparent anesthesia complications

## 2012-12-03 NOTE — Progress Notes (Signed)
Just prior to going back patient reported that he had money and medicine with him. Wallet counted and witnessed, taken to security and locked up in lock box #3. Percocet taken to pharmacy. All accounting sheets for same given Guadalupe Maple, RN

## 2012-12-04 NOTE — Progress Notes (Signed)
UR completed 

## 2012-12-04 NOTE — Progress Notes (Signed)
Patient continues to have numbness in left hand although minimal.  Can lift arm and move fingers.  PRN pain medication given d/t increase feeling and pain.  7 on pain scale 0-10.

## 2012-12-04 NOTE — Evaluation (Signed)
Occupational Therapy Evaluation and Discharge Patient Details Name: Chad Erickson MRN: [id number] DOB: Jun 02, 1970 Today's Date: 12/04/2012 Time: 4098-1191 OT Time Calculation (min): 22 min  OT Assessment / Plan / Recommendation History of present illness ORIF of left forearm radial shaft fracture    Clinical Impression   This 42 yo male admitted with above. All education completed, will D/C from acute OT.    OT Assessment   (progress arm rehab per MD)    Follow Up Recommendations   (per MD)       Equipment Recommendations  None recommended by OT          Precautions / Restrictions Precautions Required Braces or Orthoses: Sling Restrictions Weight Bearing Restrictions: Yes LUE Weight Bearing: Non weight bearing   Pertinent Vitals/Pain No pain due to nerve block not worn off yet    ADL  Equipment Used:  (sling) Transfers/Ambulation Related to ADLs: Independent ADL Comments: Mod I with BADLs; educated on positioning for edema control with pt able to tell me that his hand needs to be higher than his elbow. Educated to keep the arm/cast dry; when dressing put LUE in first and take out last        Visit Information  Last OT Received On: 12/04/12 Assistance Needed: +1 History of Present Illness: ORIF of left forearm radial shaft fracture        Prior Functioning     Home Living Family/patient expects to be discharged to:: Private residence Living Arrangements: Spouse/significant other Prior Function Level of Independence: Independent Communication Communication: Prefers language other than English (spainish; however speaks sufficient English) Dominant Hand: Right         Vision/Perception Vision - History Baseline Vision: No visual deficits   Cognition  Cognition Arousal/Alertness: Awake/alert Behavior During Therapy: WFL for tasks assessed/performed Overall Cognitive Status: Within Functional Limits for tasks assessed    Extremity/Trunk  Assessment Upper Extremity Assessment Upper Extremity Assessment: LUE deficits/detail LUE Deficits / Details: ORIF radial shaft with cast blocking wrist and elbow with some interference with hand function. LUE Coordination: decreased fine motor;decreased gross motor     Mobility Bed Mobility Bed Mobility: Supine to Sit;Sitting - Scoot to Edge of Bed Supine to Sit: 7: Independent Sitting - Scoot to [place] of Bed: 7: Independent Transfers Transfers: Sit to Stand;Stand to [employer] to Stand: 7: Independent Stand to Sit: 7: Independent     Exercise Other Exercises Other Exercises: Educated pt on shoulder flexion/extension; shoulder horizontal abduction/adduction; and digit flexion/extension  (he was able to show me the exercises he is to do with his RUE due to his LUE was still totally numb due to nerve block used for surgery). 10 reps 5x/day      End of Session OT - End of Session Activity Tolerance: Patient tolerated treatment well Patient left: in chair;with call bell/phone within reach  GO Functional Assessment Tool Used: Clinical observation Functional Limitation: Self care Self Care Current Status (Y7829): At least 1 percent but less than 20 percent impaired, limited or restricted Self Care Goal Status (F6213): At least 1 percent but less than 20 percent impaired, limited or restricted Self Care Discharge Status [phone]): At least 1 percent but less than 20 percent impaired, limited or restricted   Evette Georges [phone] 12/04/2012, 1:17 PM

## 2012-12-04 NOTE — Discharge Summary (Signed)
Physician Discharge Summary  Patient ID: Chad Erickson MRN: [id number] DOB/AGE: December 30, 1970 42 y.o.  Admit date: 12/03/2012 Discharge date: 12/05/2012  Admission Diagnoses: LEFT FOREARM RADIAL SHAFT FRACTURE Past Medical History  Diagnosis Date  . Complication of anesthesia     ? water in lungs after hernia surgery    Discharge Diagnoses:  Left radial shaft fracture  Surgeries: Procedure(s): OPEN REDUCTION INTERNAL FIXATION (ORIF) LEFT RADIAL SHAFT FRACTURE on 12/03/2012    Consultants:    Discharged Condition: Improved  Hospital Course: Chad Erickson is an 42 y.o. male who was admitted 12/03/2012 with a chief complaint of No chief complaint on file. , and found to have a diagnosis of LEFT FOREARM RADIAL SHAFT FRACTURE.  They were brought to the operating room on 12/03/2012 and underwent Procedure(s): OPEN REDUCTION INTERNAL FIXATION (ORIF) LEFT RADIAL SHAFT FRACTURE.    They were given perioperative antibiotics: Anti-infectives   Start     Dose/Rate Route Frequency Ordered Stop   12/04/12 0230  ceFAZolin (ANCEF) IVPB 1 g/50 mL premix     1 g 100 mL/hr over 30 Minutes Intravenous 3 times per day 12/03/12 2241     12/03/12 2245  ceFAZolin (ANCEF) IVPB 1 g/50 mL premix  Status:  Discontinued     1 g 100 mL/hr over 30 Minutes Intravenous NOW 12/03/12 2241 12/03/12 2245   12/03/12 1530  ceFAZolin (ANCEF) IVPB 2 g/50 mL premix     2 g 100 mL/hr over 30 Minutes Intravenous On call to O.R. 12/03/12 1451 12/03/12 1816    .  They were given sequential compression devices, early ambulation, and Other (comment)ambulation for DVT prophylaxis.  Recent vital signs: Patient Vitals for the past 24 hrs:  BP Temp Temp src Pulse Resp SpO2 Height Weight  12/04/12 0628 119/62 mmHg 98 F (36.7 C) Oral 66 16 97 % - -  12/04/12 0306 107/64 mmHg 97.6 F (36.4 C) Oral 93 16 99 % - -  12/03/12 2307 117/62 mmHg 97.9 F (36.6 C) Oral 69 16 100 % - -  12/03/12 2242 - - - - - 100 % -  -  12/03/12 2223 - 97.9 F (36.6 C) - 63 14 97 % - -  12/03/12 2215 111/61 mmHg - - 64 15 99 % - -  12/03/12 2200 104/54 mmHg - - 72 13 99 % - -  12/03/12 2145 108/58 mmHg 98.5 F (36.9 C) - 65 7 100 % - -  12/03/12 1755 117/61 mmHg - - 73 26 100 % - -  12/03/12 1750 - - - 73 14 100 % - -  12/03/12 1745 - - - 68 13 100 % - -  12/03/12 1740 119/52 mmHg - - 74 14 100 % - -  12/03/12 1735 - - - 74 14 100 % - -  12/03/12 1730 - - - 73 14 100 % - -  12/03/12 1725 124/55 mmHg - - 72 14 100 % - -  12/03/12 1720 - - - 77 17 100 % - -  12/03/12 1715 - - - 85 17 100 % - -  12/03/12 1710 113/51 mmHg - - 90 22 100 % - -  12/03/12 1705 - - - 72 14 99 % - -  12/03/12 1700 - - - 81 18 99 % - -  12/03/12 1655 111/54 mmHg - - 73 16 99 % - -  12/03/12 1650 - - - 72 13 99 % - -  12/03/12 1645 - - -  74 15 100 % - -  12/03/12 1640 - - - 86 15 98 % - -  12/03/12 1637 121/56 mmHg - - 77 15 98 % - -  12/03/12 1636 - - - 76 14 99 % - -  12/03/12 1635 - - - 82 16 99 % - -  12/03/12 1634 - - - 80 15 99 % - -  12/03/12 1633 - - - 81 16 100 % - -  12/03/12 1632 123/61 mmHg - - 83 15 100 % - -  12/03/12 1631 - - - 97 20 100 % - -  12/03/12 1630 - - - 73 11 100 % - -  12/03/12 1629 - - - 83 18 100 % - -  12/03/12 1628 - - - 77 12 100 % - -  12/03/12 1627 121/58 mmHg - - 68 10 100 % - -  12/03/12 1626 - - - 67 8 100 % - -  12/03/12 1625 - - - 70 12 100 % - -  12/03/12 1624 - - - 69 12 100 % - -  12/03/12 1623 - - - 70 11 100 % - -  12/03/12 1622 125/66 mmHg - - 72 13 99 % - -  12/03/12 1621 - - - 71 11 100 % - -  12/03/12 1620 - - - 72 11 100 % - -  12/03/12 1619 - - - 70 11 100 % - -  12/03/12 1618 - - - 78 17 100 % - -  12/03/12 1617 136/79 mmHg - - 70 13 100 % - -  12/03/12 1616 - - - 77 15 100 % - -  12/03/12 1615 - - - 79 17 100 % - -  12/03/12 1614 - - - 76 16 100 % - -  12/03/12 1613 - - - 75 20 100 % - -  12/03/12 1612 136/77 mmHg - - 71 14 100 % - -  12/03/12 1611 - - - 69 13 100 % - -   12/03/12 1610 - - - 72 14 100 % - -  12/03/12 1609 - - - 70 17 100 % - -  12/03/12 1608 135/72 mmHg - - 72 14 100 % - -  12/03/12 1607 - - - 81 22 100 % - -  12/03/12 1518 133/69 mmHg 97.1 F (36.2 C) Oral 62 18 100 % 5\' 6"  (1.676 m) 84.114 kg (185 lb 7 oz)  .  Recent laboratory studies: No results found.  Discharge Medications:     Medication List    STOP taking these medications       naproxen sodium 220 MG tablet  Commonly known as:  ANAPROX     oxyCODONE-acetaminophen 5-325 MG per tablet  Commonly known as:  PERCOCET/ROXICET  Replaced by:  oxyCODONE-acetaminophen 10-325 MG per tablet     oxyCODONE-acetaminophen 5-325 MG per tablet  Commonly known as:  PERCOCET/ROXICET  Replaced by:  oxyCODONE-acetaminophen 10-325 MG per tablet      TAKE these medications       docusate sodium 100 MG capsule  Commonly known as:  COLACE  Take 1 capsule (100 mg total) by mouth 2 (two) times daily.     methocarbamol 500 MG tablet  Commonly known as:  ROBAXIN  Take 1 tablet (500 mg total) by mouth 4 (four) times daily.     ondansetron 4 MG tablet  Commonly known as:  ZOFRAN  Take 1 tablet (4 mg total) by mouth  every 8 (eight) hours as needed for nausea or vomiting.     oxyCODONE-acetaminophen 10-325 MG per tablet  Commonly known as:  PERCOCET  Take 1 tablet by mouth every 4 (four) hours as needed for pain.     oxyCODONE-acetaminophen 10-325 MG per tablet  Commonly known as:  PERCOCET  Take 1 tablet by mouth every 4 (four) hours as needed for pain.     vitamin C 500 MG tablet  Commonly known as:  ASCORBIC ACID  Take 1 tablet (500 mg total) by mouth daily.        Diagnostic Studies: Dg Elbow 2 Views Left  11/29/2012   CLINICAL DATA:  Trauma  EXAM: LEFT ELBOW - 2 VIEW  COMPARISON:  None.  FINDINGS: There is no evidence of fracture, dislocation, or joint effusion within the elbow. There is no evidence of arthropathy or other focal bone abnormality. Soft tissues are  unremarkable. A radial shaft fracture is partially visualized.  IMPRESSION: No evidence of acute osseous abnormalities within the elbow.   Electronically Signed   By: Salome Holmes M.D.   On: 11/29/2012 17:46   Dg Forearm Left  11/29/2012   CLINICAL DATA:  Trauma  EXAM: LEFT FOREARM - 2 VIEW  COMPARISON:  None.  FINDINGS: Comminuted fracture identified along the midshaft of the radius demonstrating dorsal ulnar directed displacement and angulation.  IMPRESSION: Comminuted radial shaft fracture   Electronically Signed   By: Salome Holmes M.D.   On: 11/29/2012 17:44   Dg Wrist Complete Left  11/29/2012   CLINICAL DATA:  Trauma, pain  EXAM: LEFT WRIST - COMPLETE 3+ VIEW  COMPARISON:  None.  FINDINGS: There is no evidence of fracture or dislocation. There is no evidence of arthropathy or other focal bone abnormality. Soft tissues are unremarkable.  IMPRESSION: Negative.   Electronically Signed   By: Salome Holmes M.D.   On: 11/29/2012 17:43   Dg Humerus Left  11/29/2012   CLINICAL DATA:  Trauma, fall, injury left to left forearm  EXAM: LEFT HUMERUS - 2+ VIEW  COMPARISON:  None  FINDINGS: Osseous mineralization normal.  Joint spaces preserved.  No fracture, dislocation, or bone destruction.  IMPRESSION: No acute osseous abnormalities.   Electronically Signed   By: Ulyses Southward M.D.   On: 11/29/2012 17:47    They benefited maximally from their hospital stay and there were no complications.     Disposition: 01-Home or Self Care      Follow-up Information   Follow up with Sharma Covert, MD. Schedule an appointment as soon as possible for a visit in 14 days.   Specialty:  Orthopedic Surgery   Contact information:   [address] [place] [address] [phone]        Signed: Sharma Covert 12/04/2012, 8:23 AM

## 2012-12-04 NOTE — Progress Notes (Addendum)
   CARE MANAGEMENT NOTE 12/04/2012  Patient:  Chad Erickson, Chad Erickson   Account Number:  [id number]  Date Initiated:  12/04/2012  Documentation initiated by:  [facility]  Subjective/Objective Assessment:   Open treatment of left forearm radial shaft fracture requiring internal fixation     Action/Plan:   No OT reommended.   Anticipated DC Date:  12/05/2012   Anticipated DC Plan:  HOME/SELF CARE      DC Planning Services  CM consult      Choice offered to / List presented to:             Status of service:  Completed, signed off Medicare Important Message given?   (If response is "NO", the following Medicare IM given date fields will be blank) Date Medicare IM given:   Date Additional Medicare IM given:    Discharge Disposition:  HOME/SELF CARE  Per UR Regulation:    If discussed at Long Length of Stay Meetings, dates discussed:    Comments:  12/04/2012 1215 NCM spoke to pt and states he does not have any info on [employer]. Pt gave permission to fax paperwork to [employer]. No HH or DME reommended at this time. NCM contacted Thornell Mule, RN Worker's Comp CW # [phone] ext [phone]. Requested progress notes.  Isidoro Donning RN CCM Case Mgmt phone [phone]

## 2012-12-04 NOTE — Op Note (Signed)
NAMEAVYAAN, SUMMER           ACCOUNT NO.:  [id number]  MEDICAL RECORD NO.:  [id number]  LOCATION:  5N30C                        FACILITY:  [facility]  PHYSICIAN:  Madelynn Done, MD  DATE OF BIRTH:  Jun 25, 1970  DATE OF PROCEDURE:  12/03/2012 DATE OF DISCHARGE:                              OPERATIVE REPORT   PREOPERATIVE DIAGNOSES:  Left forearm radial shaft fracture.  POSTOPERATIVE DIAGNOSIS:  Left forearm radial shaft fracture.  ATTENDING PHYSICIAN:  Madelynn Done, MD, who was scrubbed and present for the entire procedure.  ASSISTANT SURGEON:  None.  ANESTHESIA:  General via LMA.  SURGICAL PROCEDURE: 1. Open treatment of left forearm radial shaft fracture requiring     internal fixation. 2. Radiographs 3 views, left forearm.  SURGICAL IMPLANTS:  DePuy small fragment plate with 3 screws proximally, 3 screws distally.  SURGICAL INDICATIONS:  Mr. Jabs is a right-hand-dominant gentleman who sustained a closed injury to his left forearm.  The patient was seen and evaluated and given the degree of displacement, it was recommended that he undergo the above procedure.  Risks, benefits, and alternatives were discussed in detail with the patient and signed informed consent was obtained.  Risks include, but not limited to bleeding, infection, damage to nearby nerves, arteries, or tendons, loss of motion of the wrist and digits, incomplete relief of symptoms, and the need for further surgical intervention.  DESCRIPTION OF PROCEDURE:  The patient was properly identified in the preop holding area and marked with a permanent marker made on the left forearm to indicate the correct operative site.  The patient was then brought back up room, placed supine on anesthesia room table.  General anesthesia was administered.  The patient tolerated this well.  A well- padded tourniquet placed on the left forearm sealed with 1000 drape. Left upper extremity was then prepped and draped  in normal sterile fashion.  Time-out was called, correct side was identified, and procedure then begun.  Attention then turned to left forearm, a curvilinear incision made directly over the radial shaft.  Limb was elevated and tourniquet insufflated.  Deep dissection carried down through the skin and subcutaneous tissue.  The FCR sheath was then opened  going through the interval.  The blunt dissection carried all the way down to the bone.  The FPL was then carefully elevated.  Blunt dissection was done throughout exposing the radial shaft.  The patient did have a lot of soft tissue injury and very significant muscle injury to the FPL muscle belly.  Once this was done, the fracture hematoma was then evacuated.  The patient did have a large butterfly fragment and various unstable oblique fracture.  The wound was then irrigated. Reduction clamps were then used to hold the reduction.  Time was spent carefully meticulously trying to maintain the reduction and then put the plate on given the large butterfly fragment.  This fragment was important to maintain the length and overall stability given its size and aid in appropriate alignment and reduction.  Once this was carried out, it was held in place with a reduction clamp and a 0.60 K-wire was then placed to reduce the proximal segment.  Once the proximal segment was  reduced, the distal segment was then reduced to this and then held in place with a reduction clamp.  A 7-hole plate was then precontoured and applied on the volar surface.  This was then held down in place and position was then confirmed using mini C-arm.  Once this was adequately placed, the distal fixation was then carried out and the first screw was able to gain lag screw through the plate obtaining good compression with the butterfly fragment.  Once this was carried out, proximal fixation was then carried out.  Standard compression plating technique was then used for a total  of 3 screws proximally, 3 screws distally.  The wound was then thoroughly irrigated.  Copious wound irrigation done.  Final radiographs were then obtained.  AP lateral views of the forearm and oblique films do show the internal fixation in place.  There was good alignment of the radial shaft in all planes.  The patient does have some mild comminution along the ulnar column.  The wound was irrigated. Tourniquet deflated.  There was good hemostasis.  Hand was warm and well perfused.  Subcutaneous tissues was closed with 2-0 and 4-0 Vicryl and the skin closed with simple 3-0 Prolene.  Adaptic dressing, sterile compressive bandage then applied.  The patient then placed in a well- padded sugar-tong splint, extubated, and taken to recovery room in good condition.  Radiographs 3 views of the forearm did show the internal fixation in place.  There was good position in both planes.  POSTPROCEDURE PLAN:  The patient was discharged to home, admitted overnight for IV antibiotics and pain control.  Discharged in the morning, seen back in the office in approximately 2 weeks for wound check, suture removal, and begin a postoperative ORIF protocol. Radiographs at each visit.     Madelynn Done, MD     FWO/MEDQ  D:  12/03/2012  T:  12/04/2012  Job:  [id number]

## 2012-12-04 NOTE — Progress Notes (Signed)
Patient has had increased pain.  PRN Percocet 5/325mg  2tabs given for 9 pain (0-10 scale).  Patient called RN back to room d/t unrelieved pain 10.  PRN Dilaudid 1mg  IV given.

## 2012-12-05 ENCOUNTER — Encounter (HOSPITAL_COMMUNITY): Payer: Self-pay | Admitting: Orthopedic Surgery

## 2012-12-05 NOTE — Care Management Utilization Note (Signed)
Utilization review completed. Maleia Weems, RN BSN 

## 2012-12-05 NOTE — OR Nursing (Signed)
Hydrocodone 1 mg wasted w/ Charlann Lange RN, was pulled and opened but never needed on 11/24.

## 2012-12-05 NOTE — Progress Notes (Signed)
Spoke with Dr Melvyn Novas, pt to stay tonight d/t uncontrolled pain.  Pt was educated about arm injury, safety and MD orders and s/s of infection.  Nsg to continue to monitor for status changes.

## 2013-08-02 ENCOUNTER — Encounter (HOSPITAL_COMMUNITY): Payer: Self-pay | Admitting: Emergency Medicine

## 2013-08-02 ENCOUNTER — Emergency Department (HOSPITAL_COMMUNITY)
Admission: EM | Admit: 2013-08-02 | Discharge: 2013-08-02 | Disposition: A | Discharge status: Home / Self Care | Payer: 59 | Attending: Emergency Medicine | Admitting: Emergency Medicine

## 2013-08-02 ENCOUNTER — Emergency Department (HOSPITAL_COMMUNITY): Payer: 59

## 2013-08-02 DIAGNOSIS — J45901 Unspecified asthma with (acute) exacerbation: Secondary | ICD-10-CM | POA: Insufficient documentation

## 2013-08-02 DIAGNOSIS — IMO0002 Reserved for concepts with insufficient information to code with codable children: Secondary | ICD-10-CM | POA: Insufficient documentation

## 2013-08-02 DIAGNOSIS — Z79899 Other long term (current) drug therapy: Secondary | ICD-10-CM | POA: Insufficient documentation

## 2013-08-02 HISTORY — DX: Unspecified asthma, uncomplicated: J45.909

## 2013-08-02 MED ORDER — ALBUTEROL SULFATE (2.5 MG/3ML) 0.083% IN NEBU
5.0000 mg | INHALATION_SOLUTION | Freq: Once | RESPIRATORY_TRACT | Status: AC
  Administered 2013-08-02: 5 mg via RESPIRATORY_TRACT
  Filled 2013-08-02: qty 6

## 2013-08-02 MED ORDER — PREDNISONE 20 MG PO TABS
60.0000 mg | ORAL_TABLET | Freq: Once | ORAL | Status: AC
  Administered 2013-08-02: 60 mg via ORAL
  Filled 2013-08-02: qty 3

## 2013-08-02 MED ORDER — PREDNISONE 20 MG PO TABS: 40.0000 mg | ORAL_TABLET | Freq: Every day | ORAL | Status: DC

## 2013-08-02 MED ORDER — ALBUTEROL SULFATE HFA 108 (90 BASE) MCG/ACT IN AERS
2.0000 | INHALATION_SPRAY | Freq: Once | RESPIRATORY_TRACT | Status: AC
  Administered 2013-08-02: 2 via RESPIRATORY_TRACT
  Filled 2013-08-02: qty 6.7

## 2013-08-02 NOTE — ED Notes (Signed)
Pt. reports asthma attack onset yesterday with productive cough  and wheezing , denies fever or chills.

## 2013-08-02 NOTE — ED Provider Notes (Signed)
Medical screening examination/treatment/procedure(s) were performed by non-physician practitioner and as supervising physician I was immediately available for consultation/collaboration.   EKG Interpretation None       Olivia Mackielga M Merek Niu, MD 08/02/13 [phone]

## 2013-08-02 NOTE — ED Provider Notes (Signed)
CSN: [id number]     Arrival date & time 08/02/13  0346 History   First MD Initiated Contact with Patient 08/02/13 [phone]     Chief Complaint  Patient presents with  . Asthma     (Consider location/radiation/quality/duration/timing/severity/associated sxs/prior Treatment) HPI Comments: 43 year old male with a past medical history of asthma presents to the emergency department complaining of an asthma exacerbation x2 days. Patient reports 2 days ago he developed wheezing and a productive cough with clear mucus. He tried using his albuterol inhaler 2 days ago with minimal relief, however he then misplaced his inhaler. Denies fever, chills, shortness of breath, chest pain, nausea or vomiting. Pt received a neb treatment on arrival to ED prior to being seen with great relief of his symptoms.  Patient is a 43 y.o. male presenting with asthma. The history is provided by the patient.  Asthma Associated symptoms include coughing.    Past Medical History  Diagnosis Date  . Complication of anesthesia     ? water in lungs after hernia surgery  . Asthma    Past Surgical History  Procedure Laterality Date  . Nasal sinus surgery    . Hernia repair      bilateral inguinal hernias  . Tonsillectomy    . Appendectomy    . Orif radial fracture Left 12/03/2012    Procedure: OPEN REDUCTION INTERNAL FIXATION (ORIF) LEFT RADIAL SHAFT FRACTURE;  Surgeon: Sharma Covert, MD;  Location: [facility] OR;  Service: Orthopedics;  Laterality: Left;   No family history on file. History  Substance Use Topics  . Smoking status: Never Smoker   . Smokeless tobacco: Never Used  . Alcohol Use: No    Review of Systems  Respiratory: Positive for cough and wheezing.   All other systems reviewed and are negative.     Allergies  Review of patient's allergies indicates no known allergies.  Home Medications   Prior to Admission medications   Medication Sig Start Date End Date Taking? Authorizing Provider  docusate  sodium (COLACE) 100 MG capsule Take 1 capsule (100 mg total) by mouth 2 (two) times daily. 12/03/12   Sharma Covert, MD  methocarbamol (ROBAXIN) 500 MG tablet Take 1 tablet (500 mg total) by mouth 4 (four) times daily. 12/03/12   Sharma Covert, MD  ondansetron (ZOFRAN) 4 MG tablet Take 1 tablet (4 mg total) by mouth every 8 (eight) hours as needed for nausea or vomiting. 12/03/12   Sharma Covert, MD  oxyCODONE-acetaminophen (PERCOCET) 10-325 MG per tablet Take 1 tablet by mouth every 4 (four) hours as needed for pain. 12/03/12   Sharma Covert, MD  oxyCODONE-acetaminophen (PERCOCET) 10-325 MG per tablet Take 1 tablet by mouth every 4 (four) hours as needed for pain. 12/03/12   Sharma Covert, MD  predniSONE (DELTASONE) 20 MG tablet Take 2 tablets (40 mg total) by mouth daily. 08/02/13   Trevor Mace, PA-C  vitamin C (ASCORBIC ACID) 500 MG tablet Take 1 tablet (500 mg total) by mouth daily. 12/03/12   Sharma Covert, MD   BP 121/77  Pulse 71  Temp(Src) 97.2 F (36.2 C) (Oral)  Resp 15  SpO2 98% Physical Exam  Nursing note and vitals reviewed. Constitutional: He is oriented to person, place, and time. He appears well-developed and well-nourished. No distress.  HENT:  Head: Normocephalic and atraumatic.  Mouth/Throat: Oropharynx is clear and moist.  Eyes: Conjunctivae are normal.  Neck: Normal range of motion. Neck supple.  Cardiovascular: Normal  rate, regular rhythm and normal heart sounds.   Pulmonary/Chest: Effort normal.  Scattered inspiratory and expiratory wheezes bilateral.  Abdominal: Soft. Bowel sounds are normal. There is no tenderness.  Musculoskeletal: Normal range of motion. He exhibits no edema.  Neurological: He is alert and oriented to person, place, and time.  Skin: Skin is warm and dry. He is not diaphoretic.  Psychiatric: He has a normal mood and affect. His behavior is normal.    ED Course  Procedures (including critical care time) Labs Review Labs Reviewed -  No data to display  Imaging Review Dg Chest 2 View  08/02/2013   CLINICAL DATA:  Shortness of breath.  History of asthma.  EXAM: CHEST  2 VIEW  COMPARISON:  None.  FINDINGS: The lungs are well-aerated. Chronic peribronchial thickening is noted. There is no evidence of focal opacification, pleural effusion or pneumothorax.  The heart is normal in size; the mediastinal contour is within normal limits. No acute osseous abnormalities are seen.  IMPRESSION: Chronic peribronchial thickening noted; no acute cardiopulmonary process seen.   Electronically Signed   By: Roanna RaiderJeffery  Chang M.D.   On: 08/02/2013 04:51     EKG Interpretation None      MDM   Final diagnoses:  Asthma exacerbation   Pt well appearing and in NAD. AFVSS. O2 sat 98% on RA. CXR obtained prior to pt being seen, no acute findings. Pt reporting improvement after neb treatment. Scattered wheezes bilateral. Will give short course of prednisone and albuterol inhaler. Stable for d/c. Resources given for PCP follow up. Return precautions given. Patient states understanding of treatment care plan and is agreeable.  Trevor MaceRobyn M Albert, PA-C 08/02/13 [phone]

## 2013-08-02 NOTE — Discharge Instructions (Signed)
Use albuterol inhaler every 4-6 hours as needed for cough and wheezing. Take prednisone as directed beginning tomorrow as you were given the first dose in the emergency department today. Followup with the wellness clinic or one of the resources below to establish care with a primary care physician.  Asthma, Acute Bronchospasm Acute bronchospasm caused by asthma is also referred to as an asthma attack. Bronchospasm means your air passages become narrowed. The narrowing is caused by inflammation and tightening of the muscles in the air tubes (bronchi) in your lungs. This can make it hard to breathe or cause you to wheeze and cough. CAUSES Possible triggers are:  Animal dander from the skin, hair, or feathers of animals.  Dust mites contained in house dust.  Cockroaches.  Pollen from trees or grass.  Mold.  Cigarette or tobacco smoke.  Air pollutants such as dust, household cleaners, hair sprays, aerosol sprays, paint fumes, strong chemicals, or strong odors.  Cold air or weather changes. Cold air may trigger inflammation. Winds increase molds and pollens in the air.  Strong emotions such as crying or laughing hard.  Stress.  Certain medicines such as aspirin or beta-blockers.  Sulfites in foods and drinks, such as dried fruits and wine.  Infections or inflammatory conditions, such as a flu, cold, or inflammation of the nasal membranes (rhinitis).  Gastroesophageal reflux disease (GERD). GERD is a condition where stomach acid backs up into your esophagus.  Exercise or strenuous activity. SIGNS AND SYMPTOMS   Wheezing.  Excessive coughing, particularly at night.  Chest tightness.  Shortness of breath. DIAGNOSIS  Your health care provider will ask you about your medical history and perform a physical exam. A chest X-ray or blood testing may be performed to look for other causes of your symptoms or other conditions that may have triggered your asthma attack. TREATMENT    Treatment is aimed at reducing inflammation and opening up the airways in your lungs. Most asthma attacks are treated with inhaled medicines. These include quick relief or rescue medicines (such as bronchodilators) and controller medicines (such as inhaled corticosteroids). These medicines are sometimes given through an inhaler or a nebulizer. Systemic steroid medicine taken by mouth or given through an IV tube also can be used to reduce the inflammation when an attack is moderate or severe. Antibiotic medicines are only used if a bacterial infection is present.  HOME CARE INSTRUCTIONS   Rest.  Drink plenty of liquids. This helps the mucus to remain thin and be easily coughed up. Only use caffeine in moderation and do not use alcohol until you have recovered from your illness.  Do not smoke. Avoid being exposed to secondhand smoke.  You play a critical role in keeping yourself in good health. Avoid exposure to things that cause you to wheeze or to have breathing problems.  Keep your medicines up-to-date and available. Carefully follow your health care provider's treatment plan.  Take your medicine exactly as prescribed.  When pollen or pollution is bad, keep windows closed and use an air conditioner or go to places with air conditioning.  Asthma requires careful medical care. See your health care provider for a follow-up as advised. If you are more than [redacted] weeks pregnant and you were prescribed any new medicines, let your obstetrician know about the visit and how you are doing. Follow up with your health care provider as directed.  After you have recovered from your asthma attack, make an appointment with your outpatient doctor to talk about ways  to reduce the likelihood of future attacks. If you do not have a doctor who manages your asthma, make an appointment with a primary care doctor to discuss your asthma. SEEK IMMEDIATE MEDICAL CARE IF:   You are getting worse.  You have trouble  breathing. If severe, call your local emergency services (911 in the [place]  You develop chest pain or discomfort.  You are vomiting.  You are not able to keep fluids down.  You are coughing up yellow, green, brown, or bloody sputum.  You have a fever and your symptoms suddenly get worse.  You have trouble swallowing. MAKE SURE YOU:   Understand these instructions.  Will watch your condition.  Will get help right away if you are not doing well or get worse. Document Released: 04/13/2006 Document Revised: 01/01/2013 Document Reviewed: 07/04/2012 [facility] Patient Information 2015 [place], [place]. This information is not intended to replace advice given to you by your health care provider. Make sure you discuss any questions you have with your health care provider.  Asthma Attack Prevention Although there is no way to prevent asthma from starting, you can take steps to control the disease and reduce its symptoms. Learn about your asthma and how to control it. Take an active role to control your asthma by working with your health care provider to create and follow an asthma action plan. An asthma action plan guides you in:  Taking your medicines properly.  Avoiding things that set off your asthma or make your asthma worse (asthma triggers).  Tracking your level of asthma control.  Responding to worsening asthma.  Seeking emergency care when needed. To track your asthma, keep records of your symptoms, check your peak flow number using a handheld device that shows how well air moves out of your lungs (peak flow meter), and get regular asthma checkups.  WHAT ARE SOME WAYS TO PREVENT AN ASTHMA ATTACK?  Take medicines as directed by your health care provider.  Keep track of your asthma symptoms and level of control.  With your health care provider, write a detailed plan for taking medicines and managing an asthma attack. Then be sure to follow your action plan. Asthma is an ongoing  condition that needs regular monitoring and treatment.  Identify and avoid asthma triggers. Many outdoor allergens and irritants (such as pollen, mold, cold air, and air pollution) can trigger asthma attacks. Find out what your asthma triggers are and take steps to avoid them.  Monitor your breathing. Learn to recognize warning signs of an attack, such as coughing, wheezing, or shortness of breath. Your lung function may decrease before you notice any signs or symptoms, so regularly measure and record your peak airflow with a home peak flow meter.  Identify and treat attacks early. If you act quickly, you are less likely to have a severe attack. You will also need less medicine to control your symptoms. When your peak flow measurements decrease and alert you to an upcoming attack, take your medicine as instructed and immediately stop any activity that may have triggered the attack. If your symptoms do not improve, get medical help.  Pay attention to increasing quick-relief inhaler use. If you find yourself relying on your quick-relief inhaler, your asthma is not under control. See your health care provider about adjusting your treatment. WHAT CAN MAKE MY SYMPTOMS WORSE? A number of common things can set off or make your asthma symptoms worse and cause temporary increased inflammation of your airways. Keep track of your asthma  symptoms for several weeks, detailing all the environmental and emotional factors that are linked with your asthma. When you have an asthma attack, go back to your asthma diary to see which factor, or combination of factors, might have contributed to it. Once you know what these factors are, you can take steps to control many of them. If you have allergies and asthma, it is important to take asthma prevention steps at home. Minimizing contact with the substance to which you are allergic will help prevent an asthma attack. Some triggers and ways to avoid these triggers are: Animal  Dander:  Some people are allergic to the flakes of skin or dried saliva from animals with fur or feathers.   There is no such thing as a hypoallergenic dog or cat breed. All dogs or cats can cause allergies, even if they don't shed.  Keep these pets out of your home.  If you are not able to keep a pet outdoors, keep the pet out of your bedroom and other sleeping areas at all times, and keep the door closed.  Remove carpets and furniture covered with cloth from your home. If that is not possible, keep the pet away from fabric-covered furniture and carpets. Dust Mites: Many people with asthma are allergic to dust mites. Dust mites are tiny bugs that are found in every home in mattresses, pillows, carpets, fabric-covered furniture, bedcovers, clothes, stuffed toys, and other fabric-covered items.   Cover your mattress in a special dust-proof cover.  Cover your pillow in a special dust-proof cover, or wash the pillow each week in hot water. Water must be hotter than 130 F (54.4 C) to kill dust mites. Cold or warm water used with detergent and bleach can also be effective.  Wash the sheets and blankets on your bed each week in hot water.  Try not to sleep or lie on cloth-covered cushions.  Call ahead when traveling and ask for a smoke-free hotel room. Bring your own bedding and pillows in case the hotel only supplies feather pillows and down comforters, which may contain dust mites and cause asthma symptoms.  Remove carpets from your bedroom and those laid on concrete, if you can.  Keep stuffed toys out of the bed, or wash the toys weekly in hot water or cooler water with detergent and bleach. Cockroaches: Many people with asthma are allergic to the droppings and remains of cockroaches.   Keep food and garbage in closed containers. Never leave food out.  Use poison baits, traps, powders, gels, or paste (for example, boric acid).  If a spray is used to kill cockroaches, stay out of the  room until the odor goes away. Indoor Mold:  Fix leaky faucets, pipes, or other sources of water that have mold around them.  Clean floors and moldy surfaces with a fungicide or diluted bleach.  Avoid using humidifiers, vaporizers, or swamp coolers. These can spread molds through the air. Pollen and Outdoor Mold:  When pollen or mold spore counts are high, try to keep your windows closed.  Stay indoors with windows closed from late morning to afternoon. Pollen and some mold spore counts are highest at that time.  Ask your health care provider whether you need to take anti-inflammatory medicine or increase your dose of the medicine before your allergy season starts. Other Irritants to Avoid:  Tobacco smoke is an irritant. If you smoke, ask your health care provider how you can quit. Ask family members to quit smoking, too. Do not  allow smoking in your home or car.  If possible, do not use a wood-burning stove, kerosene heater, or fireplace. Minimize exposure to all sources of smoke, including incense, candles, fires, and fireworks.  Try to stay away from strong odors and sprays, such as perfume, talcum powder, hair spray, and paints.  Decrease humidity in your home and use an indoor air cleaning device. Reduce indoor humidity to below 60%. Dehumidifiers or central air conditioners can do this.  Decrease house dust exposure by changing furnace and air cooler filters frequently.  Try to have someone else vacuum for you once or twice a week. Stay out of rooms while they are being vacuumed and for a short while afterward.  If you vacuum, use a dust mask from a hardware store, a double-layered or microfilter vacuum cleaner bag, or a vacuum cleaner with a HEPA filter.  Sulfites in foods and beverages can be irritants. Do not drink beer or wine or eat dried fruit, processed potatoes, or shrimp if they cause asthma symptoms.  Cold air can trigger an asthma attack. Cover your nose and mouth  with a scarf on cold or windy days.  Several health conditions can make asthma more difficult to manage, including a runny nose, sinus infections, reflux disease, psychological stress, and sleep apnea. Work with your health care provider to manage these conditions.  Avoid close contact with people who have a respiratory infection such as a cold or the flu, since your asthma symptoms may get worse if you catch the infection. Wash your hands thoroughly after touching items that may have been handled by people with a respiratory infection.  Get a flu shot every year to protect against the flu virus, which often makes asthma worse for days or weeks. Also get a pneumonia shot if you have not previously had one. Unlike the flu shot, the pneumonia shot does not need to be given yearly. Medicines:  Talk to your health care provider about whether it is safe for you to take aspirin or non-steroidal anti-inflammatory medicines (NSAIDs). In a small number of people with asthma, aspirin and NSAIDs can cause asthma attacks. These medicines must be avoided by people who have known aspirin-sensitive asthma. It is important that people with aspirin-sensitive asthma read labels of all over-the-counter medicines used to treat pain, colds, coughs, and fever.  Beta-blockers and ACE inhibitors are other medicines you should discuss with your health care provider. HOW CAN I FIND OUT WHAT I AM ALLERGIC TO? Ask your asthma health care provider about allergy skin testing or blood testing (the RAST test) to identify the allergens to which you are sensitive. If you are found to have allergies, the most important thing to do is to try to avoid exposure to any allergens that you are sensitive to as much as possible. Other treatments for allergies, such as medicines and allergy shots (immunotherapy) are available.  CAN I EXERCISE? Follow your health care provider's advice regarding asthma treatment before exercising. It is  important to maintain a regular exercise program, but vigorous exercise or exercise in cold, humid, or dry environments can cause asthma attacks, especially for those people who have exercise-induced asthma. Document Released: 12/15/2008 Document Revised: 01/01/2013 Document Reviewed: 07/04/2012 [facility] Patient Information 2015 [place], [place]. This information is not intended to replace advice given to you by your health care provider. Make sure you discuss any questions you have with your health care provider. RESOURCE GUIDE  Chronic Pain Problems: Contact [facility]  [phone] Patients need to be referred by their primary care doctor.  Insufficient Money for Medicine: Contact [employer]:  call "211."   No Primary Care Doctor: - Call [facility]  [phone] - can help you locate a primary care doctor that  accepts your insurance, provides certain services, etc. - Physician Referral Service- [phone]  Agencies that provide inexpensive medical care: - [facility]  [phone] - [facility]  [phone] - [facility]  [phone] - [facility] Clinic  [phone] - [facility] Parenthood  [phone] - [facility]  [phone]  Medicaid-accepting [facility] Providers: - [facility]- [address]  [phone], Mon-Fri 9am-7pm, Sat 9am-1pm - [facility]- [address] [place], [address] [place]  [phone] - [facility]- [address], [address] [place]  [phone] [facility]- [address]  [phone] - Renaye Rakers- [address] [place], [address], [phone]  Only accepts [place] patients after they have their name  applied to their card  Self Pay (no insurance) in [place]: - Sickle Cell Patients: Dr Willey Blade, [facility]  [address] [place], [phone] - [facility]- [address]  [place]  [phone]       [facility]- [address]       -     [facility]- see information above (Speak to [employer] if you do not have insurance)       -  [facility]- [address],  [phone]       -  [facility]- [address] [phone]       -  Dr Julio Sicks-  [address], [place], [phone]       -  [facility] - [address] [phone]       -  [facility]- [address] [phone], also [address] [phone]       -    [facility]- [address] [place], [phone], 1st & 3rd Saturday        every month, 10am-1pm  1) Find a Doctor and Pay Out of Pocket Although you won't have to find out who is covered by your insurance plan, it is a good idea to ask around and get recommendations. You will then need to call the office and see if the doctor you have chosen will accept you as a new patient and what types of options they offer for patients who are self-pay. Some doctors offer discounts or will set up payment plans for their patients who do not have insurance, but you will need to ask so you aren't surprised when you get to your appointment.  2) Contact Your Local Health Department Not all health departments have doctors that can see patients for sick visits, but many do, so it is worth a call to see if yours does. If you don't know where your local health department is, you can check in your phone book. The CDC also has a tool to help you locate your state's health department, and many state websites also have listings of all of their local health departments.  3) Find a Walk-in Clinic If your illness is not likely to be very severe or complicated, you  may want to try a walk in clinic. These are popping up all over the country in pharmacies, drugstores, and shopping centers. They're usually staffed by nurse practitioners or physician  assistants that have been trained to treat common illnesses and complaints. They're usually fairly quick and inexpensive. However, if you have serious medical issues or chronic medical problems, these are probably not your best option

## 2013-08-10 DIAGNOSIS — J45909 Unspecified asthma, uncomplicated: Secondary | ICD-10-CM

## 2013-08-10 HISTORY — DX: Unspecified asthma, uncomplicated: J45.909

## 2013-09-05 ENCOUNTER — Encounter: Payer: Self-pay | Admitting: General Practice

## 2013-09-13 ENCOUNTER — Encounter: Payer: Self-pay | Admitting: Family Medicine

## 2013-09-13 ENCOUNTER — Ambulatory Visit (HOSPITAL_BASED_OUTPATIENT_CLINIC_OR_DEPARTMENT_OTHER): Payer: Worker's Compensation | Admitting: Family Medicine

## 2013-09-13 VITALS — BP 113/70 | HR 87 | Temp 98.1°F | Resp 16 | Ht 68.0 in | Wt 161.0 lb

## 2013-09-13 DIAGNOSIS — T148XXA Other injury of unspecified body region, initial encounter: Secondary | ICD-10-CM

## 2013-09-13 NOTE — Progress Notes (Signed)
Pt complain of blister around genital area and legs No itching and burning since June 2014

## 2013-09-17 NOTE — Progress Notes (Signed)
Patient ID: Chad Erickson, male   DOB: [birth year], 43 y.o.   MRN: [id number] Cancelled/no show

## 2013-09-20 ENCOUNTER — Ambulatory Visit: Payer: Self-pay | Attending: Family Medicine | Admitting: Family Medicine

## 2013-09-20 ENCOUNTER — Encounter: Payer: Self-pay | Admitting: Family Medicine

## 2013-09-20 VITALS — BP 121/74 | HR 99 | Temp 98.6°F | Resp 18 | Ht 66.93 in | Wt 194.0 lb

## 2013-09-20 DIAGNOSIS — Z23 Encounter for immunization: Secondary | ICD-10-CM | POA: Insufficient documentation

## 2013-09-20 DIAGNOSIS — J45909 Unspecified asthma, uncomplicated: Secondary | ICD-10-CM | POA: Insufficient documentation

## 2013-09-20 DIAGNOSIS — J4520 Mild intermittent asthma, uncomplicated: Secondary | ICD-10-CM

## 2013-09-20 MED ORDER — ALBUTEROL SULFATE HFA 108 (90 BASE) MCG/ACT IN AERS: 2.0000 | INHALATION_SPRAY | Freq: Four times a day (QID) | RESPIRATORY_TRACT | Status: DC | PRN

## 2013-09-20 NOTE — Addendum Note (Signed)
Addended by: Dyann Kief on: 09/20/2013 05:52 PM   Modules accepted: Orders

## 2013-09-20 NOTE — Patient Instructions (Signed)
Chad Erickson, Chad Erickson por venido hoy.  Thank you for coming in today. It was a pleasure meeting you. I look forward to be a primary doctor.  I have refilled albuterol. Please return in 6-12 months for second pneumovax.  Return also for increase symptoms, cough, wheeze, SOB.   Dr. Armen Pickup

## 2013-09-20 NOTE — Assessment & Plan Note (Signed)
A: mild intermittent asthma P: albuterol prn F/u prn Flu shot and pneumovax today.

## 2013-09-20 NOTE — Progress Notes (Signed)
Establish Care HFU asthma Medicine refill

## 2013-09-20 NOTE — Progress Notes (Signed)
   Subjective:    Patient ID: Chad Erickson, male    DOB: 1970-02-16, 43 y.o.   MRN: [id number] CC; establish care, asthma  HPI  1. Asthma: diagnosed in 07/2013. Shortness and breath and wheezing. Treated with albuterol which helped. Has ran out of albuterol. Now with night time wheezing 2 x per week. No SOB or cough. Works at [employer] for the past 5 years, exposed to dust regularly. Does not wear a mask regularly.   Soc hx: chronic non smoker  Review of Systems As per HPI      Objective:   Physical Exam BP 121/74  Pulse 99  Temp(Src) 98.6 F (37 C) (Oral)  Resp 18  Ht 5' 6.93" (1.7 m)  Wt 194 lb (87.998 kg)  BMI 30.45 kg/m2  SpO2 97% Wt Readings from Last 3 Encounters:  09/20/13 194 lb (87.998 kg)  09/13/13 161 lb (73.029 kg)  12/03/12 185 lb 7 oz (84.114 kg)  General appearance: alert, cooperative and no distress Lungs: clear to auscultation bilaterally Heart: regular rate and rhythm, S1, S2 normal, no murmur, click, rub or gallop    Assessment & Plan:

## 2014-01-31 ENCOUNTER — Ambulatory Visit: Payer: Self-pay | Admitting: Family Medicine

## 2014-02-06 ENCOUNTER — Ambulatory Visit: Payer: Self-pay | Admitting: Family Medicine

## 2014-07-21 ENCOUNTER — Emergency Department (INDEPENDENT_AMBULATORY_CARE_PROVIDER_SITE_OTHER)
Admission: EM | Admit: 2014-07-21 | Discharge: 2014-07-21 | Disposition: A | Discharge status: Home / Self Care | Payer: Self-pay | Source: Home / Self Care | Attending: Family Medicine | Admitting: Family Medicine

## 2014-07-21 ENCOUNTER — Encounter (HOSPITAL_COMMUNITY): Payer: Self-pay | Admitting: Emergency Medicine

## 2014-07-21 ENCOUNTER — Telehealth: Payer: Self-pay | Admitting: Family Medicine

## 2014-07-21 DIAGNOSIS — H6091 Unspecified otitis externa, right ear: Secondary | ICD-10-CM

## 2014-07-21 MED ORDER — NEOMYCIN-POLYMYXIN-HC 3.5-10000-1 OT SUSP
OTIC | Status: AC
  Filled 2014-07-21: qty 10

## 2014-07-21 MED ORDER — NEOMYCIN-POLYMYXIN-HC 1 % OT SOLN
4.0000 [drp] | Freq: Four times a day (QID) | OTIC | Status: DC
Start: 2014-07-21 — End: 2014-07-21
  Administered 2014-07-21: 4 [drp] via OTIC

## 2014-07-21 NOTE — ED Provider Notes (Signed)
CSN: [id number]     Arrival date & time 07/21/14  1455 History   First MD Initiated Contact with Patient 07/21/14 1704     Chief Complaint  Patient presents with  . Otalgia   (Consider location/radiation/quality/duration/timing/severity/associated sxs/prior Treatment) Patient is a 44 y.o. male presenting with ear pain. The history is provided by the patient.  Otalgia Location:  Right Quality:  Sore Severity:  Mild Onset quality:  Gradual Duration:  1 day Progression:  Worsening Chronicity:  New Relieved by:  None tried Worsened by:  Nothing tried Ineffective treatments:  None tried Associated symptoms: hearing loss   Associated symptoms: no ear discharge and no rhinorrhea     Past Medical History  Diagnosis Date  . Complication of anesthesia     ? water in lungs after hernia surgery  . Asthma 08/10/2013    chronic non smoker, work in [employer] for 5 years   Past Surgical History  Procedure Laterality Date  . Nasal sinus surgery    . Tonsillectomy    . Appendectomy    . Orif radial fracture Left 12/03/2012    Procedure: OPEN REDUCTION INTERNAL FIXATION (ORIF) LEFT RADIAL SHAFT FRACTURE;  Surgeon: Sharma CovertFred W Ortmann, MD;  Location: [facility] OR;  Service: Orthopedics;  Laterality: Left;  . Hernia repair      bilateral inguinal hernias   Family History  Problem Relation Age of Onset  . Hypertension Mother   . Alcohol abuse Father   . Cancer Neg Hx   . Heart disease Neg Hx    History  Substance Use Topics  . Smoking status: Never Smoker   . Smokeless tobacco: Never Used  . Alcohol Use: No    Review of Systems  Constitutional: Negative.   HENT: Positive for ear pain and hearing loss. Negative for ear discharge, postnasal drip and rhinorrhea.   Respiratory: Negative.     Allergies  Review of patient's allergies indicates no known allergies.  Home Medications   Prior to Admission medications   Medication Sig Start Date End Date Taking? Authorizing Provider   naproxen (NAPROSYN) 250 MG tablet Take 500 mg by mouth 2 (two) times daily with a meal.   Yes Historical Provider, MD  albuterol (PROVENTIL HFA;VENTOLIN HFA) 108 (90 BASE) MCG/ACT inhaler Inhale 2 puffs into the lungs every 6 (six) hours as needed for wheezing or shortness of breath. 09/20/13   Josalyn Funches, MD   BP 118/77 mmHg  Pulse 60  Temp(Src) 98.6 F (37 C) (Oral)  Resp 18  SpO2 98% Physical Exam  Constitutional: He is oriented to person, place, and time. He appears well-developed and well-nourished. No distress.  HENT:  Head: Normocephalic.  Right Ear: Tympanic membrane normal. There is swelling and tenderness. Tympanic membrane is not injected, not scarred and not erythematous. No hemotympanum. Decreased hearing is noted.  Left Ear: Tympanic membrane and external ear normal.  Mouth/Throat: Oropharynx is clear and moist. No oropharyngeal exudate.  Eyes: Pupils are equal, round, and reactive to light.  Neck: Normal range of motion. Neck supple.  Lymphadenopathy:    He has no cervical adenopathy.  Neurological: He is alert and oriented to person, place, and time.  Skin: Skin is warm and dry.  Nursing note and vitals reviewed.   ED Course  Procedures (including critical care time) Labs Review Labs Reviewed - No data to display  Imaging Review No results found.   MDM   1. External otitis, right        Fayrene FearingJames  Sallyanne Kuster, MD 07/21/14 2022

## 2014-07-21 NOTE — Telephone Encounter (Signed)
Patient called stating that he has been having an ear ache since yesterday. Please f/u with pt.

## 2014-07-21 NOTE — Telephone Encounter (Signed)
[facility]

## 2014-07-21 NOTE — ED Notes (Signed)
Pt states that he developed pain in his right ear yesterday.  He says it feels full of fluid and he has had some hearing loss with it.  He also states the ear is very itchy.

## 2014-07-23 ENCOUNTER — Encounter (HOSPITAL_COMMUNITY): Payer: Self-pay

## 2014-07-23 ENCOUNTER — Ambulatory Visit: Payer: Self-pay | Attending: Family Medicine

## 2014-07-23 ENCOUNTER — Emergency Department (HOSPITAL_COMMUNITY)
Admission: EM | Admit: 2014-07-23 | Discharge: 2014-07-23 | Disposition: A | Discharge status: Home / Self Care | Payer: Self-pay | Attending: Emergency Medicine | Admitting: Emergency Medicine

## 2014-07-23 DIAGNOSIS — J45909 Unspecified asthma, uncomplicated: Secondary | ICD-10-CM | POA: Insufficient documentation

## 2014-07-23 DIAGNOSIS — H6091 Unspecified otitis externa, right ear: Secondary | ICD-10-CM | POA: Insufficient documentation

## 2014-07-23 DIAGNOSIS — Z791 Long term (current) use of non-steroidal anti-inflammatories (NSAID): Secondary | ICD-10-CM | POA: Insufficient documentation

## 2014-07-23 DIAGNOSIS — H6121 Impacted cerumen, right ear: Secondary | ICD-10-CM | POA: Insufficient documentation

## 2014-07-23 DIAGNOSIS — R0982 Postnasal drip: Secondary | ICD-10-CM | POA: Insufficient documentation

## 2014-07-23 MED ORDER — IBUPROFEN 800 MG PO TABS
800.0000 mg | ORAL_TABLET | Freq: Once | ORAL | Status: AC
  Administered 2014-07-23: 800 mg via ORAL

## 2014-07-23 MED ORDER — CIPROFLOXACIN-DEXAMETHASONE 0.3-0.1 % OT SUSP: 4.0000 [drp] | Freq: Two times a day (BID) | OTIC | Status: DC

## 2014-07-23 MED ORDER — CETIRIZINE-PSEUDOEPHEDRINE ER 5-120 MG PO TB12: 1.0000 | ORAL_TABLET | Freq: Two times a day (BID) | ORAL | Status: DC

## 2014-07-23 NOTE — ED Provider Notes (Signed)
CSN: [id number]     Arrival date & time 07/23/14  6578 History   First MD Initiated Contact with Patient 07/23/14 0533     Chief Complaint  Patient presents with  . Otalgia     (Consider location/radiation/quality/duration/timing/severity/associated sxs/prior Treatment) Patient is a 44 y.o. male presenting with ear pain. The history is provided by the patient.  Otalgia Location:  Right Behind ear:  No abnormality Quality:  Aching Severity:  Moderate Onset quality:  Gradual Timing:  Constant Progression:  Unchanged Chronicity:  New Context: not direct blow   Relieved by:  Nothing Worsened by:  Nothing tried Ineffective treatments:  None tried Associated symptoms: no abdominal pain and no fever   Risk factors: no recent travel     Past Medical History  Diagnosis Date  . Complication of anesthesia     ? water in lungs after hernia surgery  . Asthma 08/10/2013    chronic non smoker, work in [employer] for 5 years   Past Surgical History  Procedure Laterality Date  . Nasal sinus surgery    . Tonsillectomy    . Appendectomy    . Orif radial fracture Left 12/03/2012    Procedure: OPEN REDUCTION INTERNAL FIXATION (ORIF) LEFT RADIAL SHAFT FRACTURE;  Surgeon: Sharma Covert, MD;  Location: [facility] OR;  Service: Orthopedics;  Laterality: Left;  . Hernia repair      bilateral inguinal hernias   Family History  Problem Relation Age of Onset  . Hypertension Mother   . Alcohol abuse Father   . Cancer Neg Hx   . Heart disease Neg Hx    History  Substance Use Topics  . Smoking status: Never Smoker   . Smokeless tobacco: Never Used  . Alcohol Use: No    Review of Systems  Constitutional: Negative for fever.  HENT: Positive for ear pain. Negative for drooling, trouble swallowing and voice change.   Gastrointestinal: Negative for abdominal pain.  All other systems reviewed and are negative.     Allergies  Review of patient's allergies indicates no known  allergies.  Home Medications   Prior to Admission medications   Medication Sig Start Date End Date Taking? Authorizing Provider  albuterol (PROVENTIL HFA;VENTOLIN HFA) 108 (90 BASE) MCG/ACT inhaler Inhale 2 puffs into the lungs every 6 (six) hours as needed for wheezing or shortness of breath. 09/20/13   Josalyn Funches, MD  naproxen (NAPROSYN) 250 MG tablet Take 500 mg by mouth 2 (two) times daily with a meal.    Historical Provider, MD   BP 132/85 mmHg  Pulse 64  Temp(Src) 98.5 F (36.9 C)  Resp 18  Ht  (1.753 m)  Wt 185 lb (83.915 kg)  BMI 27.31 kg/m2  SpO2 100% Physical Exam  Constitutional: He is oriented to person, place, and time. He appears well-developed and well-nourished. No distress.  HENT:  Head: Normocephalic and atraumatic. Head is without raccoon's eyes and without Battle's sign.  Right Ear: No mastoid tenderness. No hemotympanum.  Left Ear: No mastoid tenderness. Tympanic membrane is not injected, not scarred and not perforated. No hemotympanum.  Mouth/Throat: Oropharynx is clear and moist.  Right canal is impacted with cerumen  Eyes: Conjunctivae and EOM are normal. Pupils are equal, round, and reactive to light.  Neck: Normal range of motion. Neck supple.  Cardiovascular: Normal rate, regular rhythm and intact distal pulses.   Pulmonary/Chest: Effort normal and breath sounds normal. No respiratory distress. He has no wheezes. He has no rales.  Abdominal: Soft. Bowel sounds are normal. There is no tenderness. There is no rebound and no guarding.  Musculoskeletal: Normal range of motion. He exhibits no edema.  Neurological: He is alert and oriented to person, place, and time.  Skin: Skin is warm and dry.  Psychiatric: He has a normal mood and affect.    ED Course  Procedures (including critical care time) Labs Review Labs Reviewed - No data to display  Imaging Review No results found.   EKG Interpretation None      MDM   Final diagnoses:  None     Post irrigation the TM the TM is visible and normal but there is debris in the canal consistent with Otitis externa.  Will change to ciprodex and and a decongestant and have patient follow up with ENT for ongoing evaluation and care.  Patient verbalizes understanding and agrees to follow up    Elan Brainerd, MD 07/23/14 [phone]

## 2014-07-23 NOTE — ED Notes (Signed)
Patient here for ear pain on right side, seen at emergency care Monday and voices no relief with gtts given, pt sts hearing is decreased today

## 2014-08-01 ENCOUNTER — Ambulatory Visit: Payer: Self-pay | Attending: Family Medicine | Admitting: Family Medicine

## 2014-08-01 ENCOUNTER — Encounter: Payer: Self-pay | Admitting: Family Medicine

## 2014-08-01 VITALS — BP 124/77 | HR 70 | Temp 98.9°F | Resp 16 | Wt 189.0 lb

## 2014-08-01 DIAGNOSIS — H9191 Unspecified hearing loss, right ear: Secondary | ICD-10-CM | POA: Insufficient documentation

## 2014-08-01 DIAGNOSIS — H6091 Unspecified otitis externa, right ear: Secondary | ICD-10-CM | POA: Insufficient documentation

## 2014-08-01 DIAGNOSIS — H1012 Acute atopic conjunctivitis, left eye: Secondary | ICD-10-CM | POA: Insufficient documentation

## 2014-08-01 DIAGNOSIS — J309 Allergic rhinitis, unspecified: Secondary | ICD-10-CM | POA: Insufficient documentation

## 2014-08-01 MED ORDER — FLUTICASONE PROPIONATE 50 MCG/ACT NA SUSP: 2.0000 | Freq: Every day | NASAL | Status: AC

## 2014-08-01 MED ORDER — CETIRIZINE HCL 10 MG PO TABS: 10.0000 mg | ORAL_TABLET | Freq: Every day | ORAL | Status: AC

## 2014-08-01 NOTE — Patient Instructions (Signed)
Chad Erickson,  Thank you for coming in today  1. 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 Allergic rhinoconjunctivitis Continue zyrtec 10 mg daily  flonase nasal steroid  F/u in 6 weeks for hearing loss  Dr. Armen Pickup

## 2014-08-01 NOTE — Progress Notes (Signed)
ER F/U ear infection  Requesting ENT referral  Rt eye possible infected, discharge, pain, swelling no redness noted

## 2014-08-01 NOTE — Assessment & Plan Note (Signed)
Allergic rhinoconjunctivitis Continue zyrtec 10 mg daily  flonase nasal steroid

## 2014-08-01 NOTE — Progress Notes (Signed)
   Subjective:    Patient ID: Chad Erickson, male    DOB: November 21, 1970, 44 y.o.   MRN: [id number] CC: ED f.u ear infection, R eye pain and redness  Spanish interpreter present  HPI 44 yo M presents for f/u visit  1. R otitis externa: went to UC on on 07/21/14 and ED on 07/23/14. Given drops. No fever. finished drops. Pain is gone. Has R sided hearing loss. Has similar hearing in setting of loud noise exposure at work some years ago that resolved with what sounds like a steroid taper, but patient is not sure.   2. R eye redness: woke up this AM with L upper lid swelling and mildly red and itchy. No conjuctiva redness. No discharge now. Some discharge this AM.   Soc Hx: non smoker  Review of Systems  Constitutional: Negative for fever and chills.  HENT: Positive for hearing loss. Negative for congestion, dental problem, ear discharge and ear pain.        Objective:   Physical Exam BP 124/77 mmHg  Pulse 70  Temp(Src) 98.9 F (37.2 C) (Oral)  Resp 16  Wt 189 lb (85.73 kg)  SpO2 98% General appearance: alert, cooperative and no distress Head: Normocephalic, without obvious abnormality, atraumatic Eyes: negative findings: conjunctivae and sclerae normal and corneas clear, positive findings: eyelids/periorbital: L medial upper lid with mild erythema and swelling  Ears: normal TM and external ear canal left ear and abnormal external canal right ear - slight swelling in canal Nose: no discharge, turbinates pink, swollen      Assessment & Plan:

## 2014-08-01 NOTE — Assessment & Plan Note (Signed)
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

## 2014-09-01 ENCOUNTER — Ambulatory Visit: Payer: Self-pay | Attending: Audiology | Admitting: Audiology

## 2014-11-10 ENCOUNTER — Telehealth: Payer: Self-pay | Admitting: Family Medicine

## 2014-11-10 ENCOUNTER — Encounter: Payer: Self-pay | Admitting: Family Medicine

## 2014-11-10 DIAGNOSIS — J4520 Mild intermittent asthma, uncomplicated: Secondary | ICD-10-CM

## 2014-11-10 MED ORDER — ALBUTEROL SULFATE HFA 108 (90 BASE) MCG/ACT IN AERS: 2.0000 | INHALATION_SPRAY | Freq: Four times a day (QID) | RESPIRATORY_TRACT | Status: DC | PRN

## 2014-11-10 NOTE — Telephone Encounter (Signed)
Pt. Called requesting a med refill on his asthma  spray. Please f/u with pt.

## 2014-11-10 NOTE — Telephone Encounter (Signed)
Refill send to [facility]  LVM to return call   Please let pt know Rx at our pharmacy

## 2015-01-17 IMAGING — CR DG CHEST 2V
2 series · 2 of 2 positions shown · non-contrast
Comparison: None.

CLINICAL DATA: Shortness of breath.  History of asthma.

EXAM:
CHEST  2 VIEW

[w chest pa]
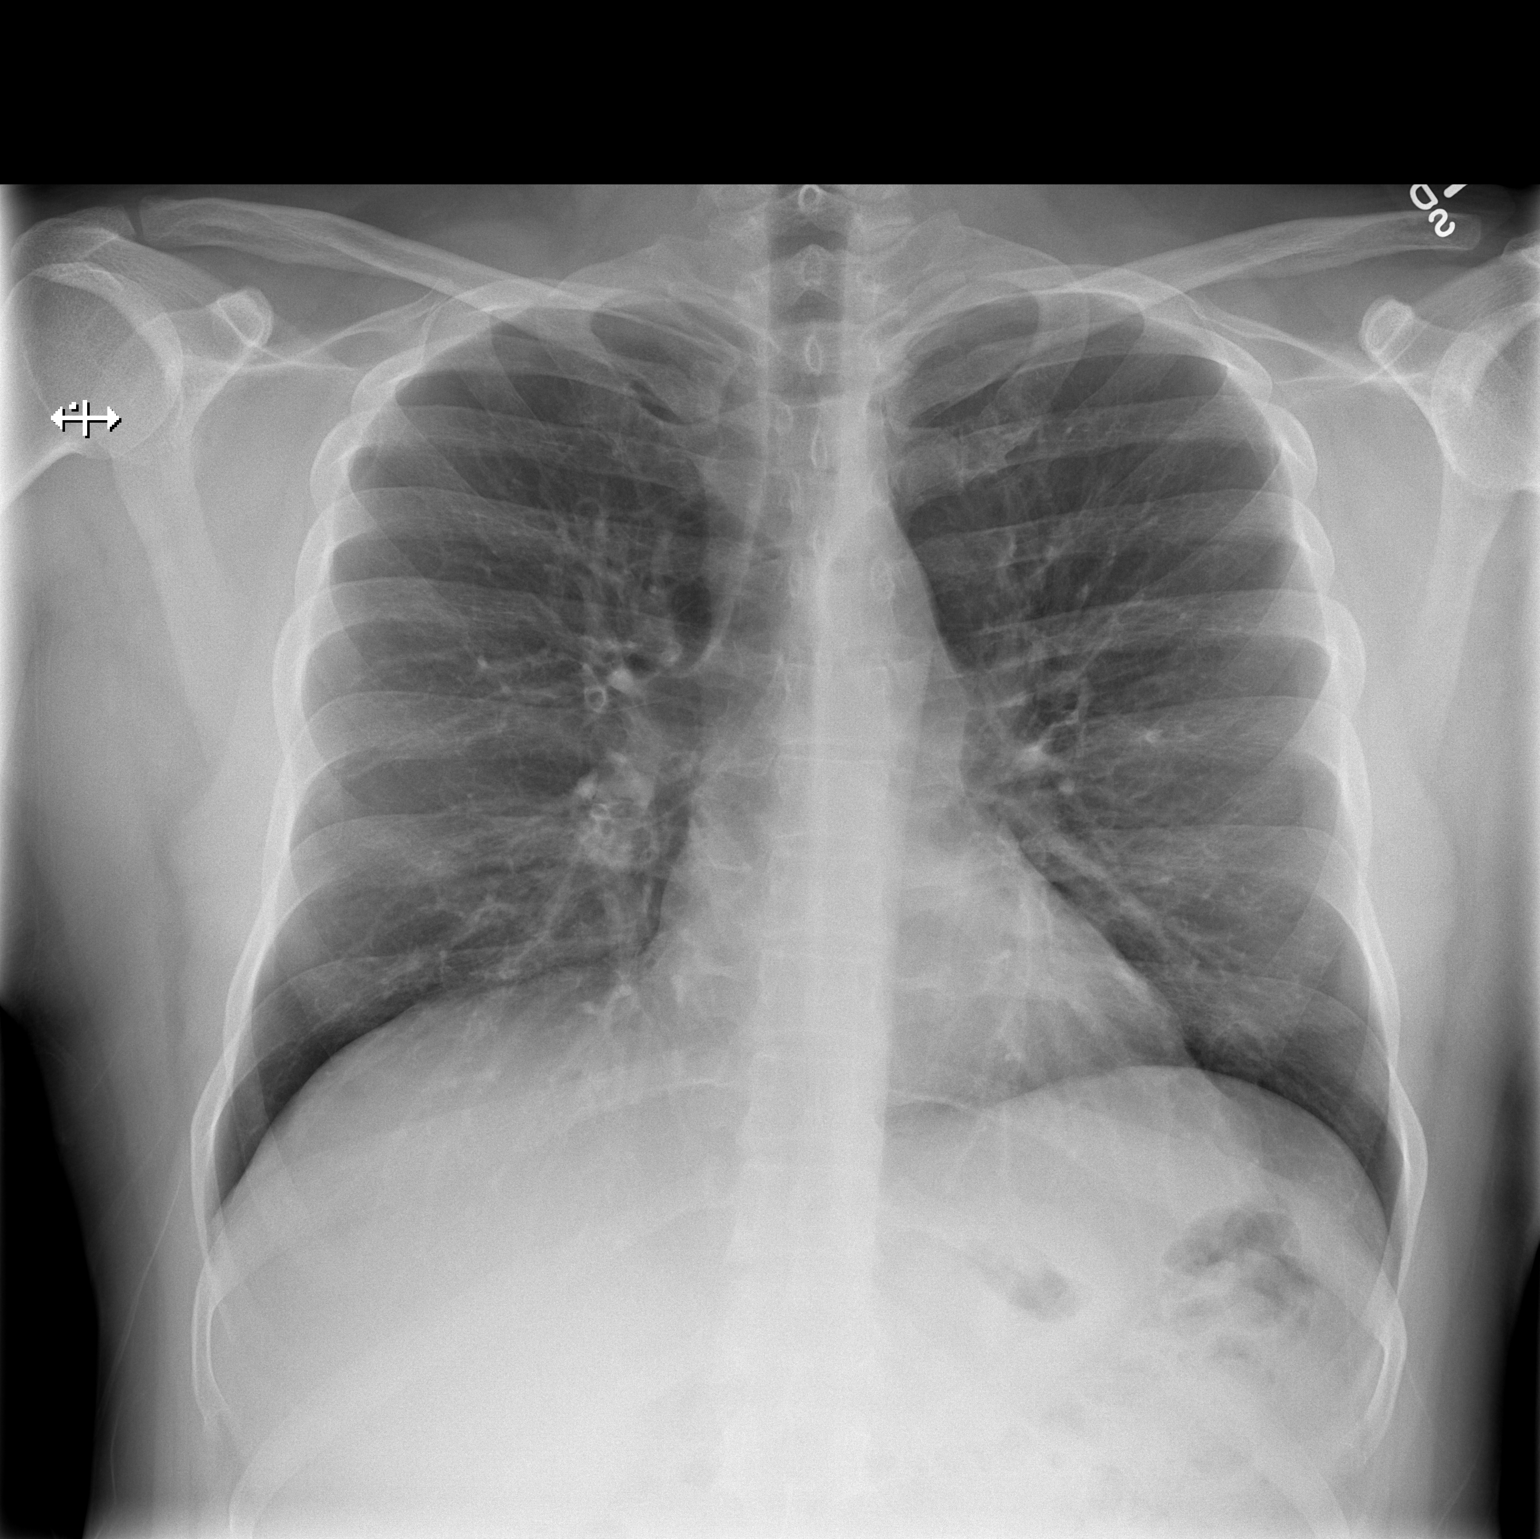

[w chest lat]
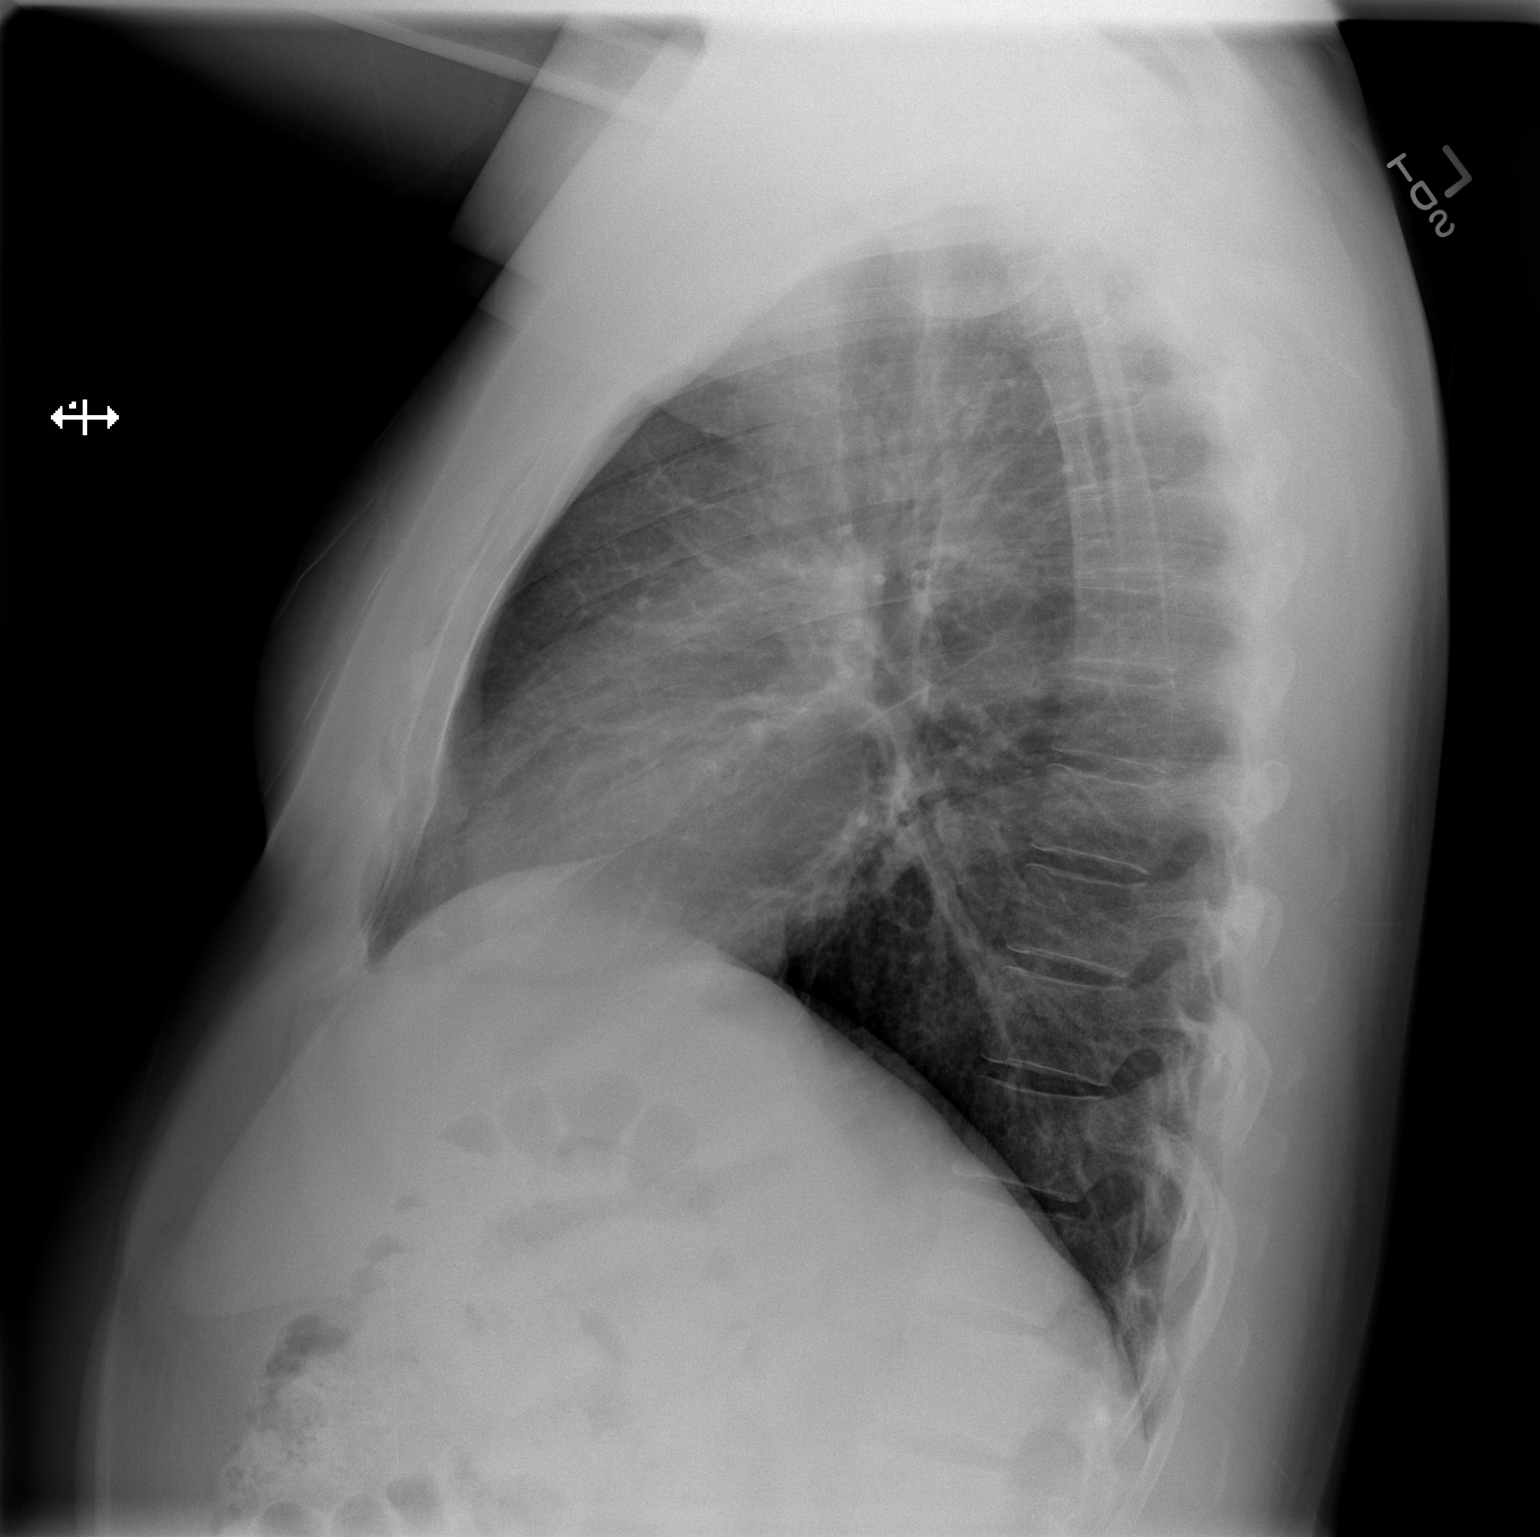

[2 of 2 positions shown; findings below may reference images not displayed]

FINDINGS: The lungs are well-aerated. Chronic peribronchial thickening is
noted. There is no evidence of focal opacification, pleural effusion
or pneumothorax.

The heart is normal in size; the mediastinal contour is within
normal limits. No acute osseous abnormalities are seen.
IMPRESSION: Chronic peribronchial thickening noted; no acute cardiopulmonary
process seen.

## 2015-06-23 ENCOUNTER — Telehealth: Payer: Self-pay | Admitting: Family Medicine

## 2015-06-23 DIAGNOSIS — J4520 Mild intermittent asthma, uncomplicated: Secondary | ICD-10-CM

## 2015-06-23 MED ORDER — ALBUTEROL SULFATE HFA 108 (90 BASE) MCG/ACT IN AERS: 2.0000 | INHALATION_SPRAY | Freq: Four times a day (QID) | RESPIRATORY_TRACT | Status: AC | PRN

## 2015-06-23 NOTE — Telephone Encounter (Signed)
Pt. Called requesting a refill on the following medication:  albuterol (PROVENTIL HFA;VENTOLIN HFA) 108 (90 BASE) MCG/ACT inhaler   Please f/u with pt.

## 2015-06-23 NOTE — Telephone Encounter (Signed)
Albuterol inhaler refilled - must have office visit for any further refills

## 2018-09-03 ENCOUNTER — Other Ambulatory Visit: Payer: Self-pay

## 2018-09-03 DIAGNOSIS — Z23 Encounter for immunization: Secondary | ICD-10-CM

## 2018-09-04 LAB — NOVEL CORONAVIRUS, NAA: SARS-CoV-2, NAA: NOT DETECTED

## 2018-09-04 LAB — SPECIMEN STATUS REPORT
